# Patient Record
Sex: Male | Born: 1980 | Race: White | Hispanic: No | Marital: Married | Smoking: Never smoker
Health system: Southern US, Community
[De-identification: ages and names within clinical notes are randomized; demographics above are authoritative.]

## PROBLEM LIST (undated history)

## (undated) DIAGNOSIS — M109 Gout, unspecified: Secondary | ICD-10-CM

## (undated) DIAGNOSIS — R945 Abnormal results of liver function studies: Secondary | ICD-10-CM

## (undated) DIAGNOSIS — R7303 Prediabetes: Secondary | ICD-10-CM

## (undated) DIAGNOSIS — T7840XA Allergy, unspecified, initial encounter: Secondary | ICD-10-CM

## (undated) DIAGNOSIS — R7989 Other specified abnormal findings of blood chemistry: Secondary | ICD-10-CM

## (undated) DIAGNOSIS — E781 Pure hyperglyceridemia: Secondary | ICD-10-CM

## (undated) HISTORY — DX: Allergy, unspecified, initial encounter: T78.40XA

## (undated) HISTORY — DX: Pure hyperglyceridemia: E78.1

## (undated) HISTORY — PX: INGUINAL HERNIA REPAIR: SUR1180

## (undated) HISTORY — DX: Abnormal results of liver function studies: R94.5

## (undated) HISTORY — DX: Other specified abnormal findings of blood chemistry: R79.89

## (undated) HISTORY — DX: Prediabetes: R73.03

---

## 2002-04-24 ENCOUNTER — Encounter: Payer: Self-pay | Admitting: Emergency Medicine

## 2002-04-24 ENCOUNTER — Emergency Department (HOSPITAL_COMMUNITY): Admission: EM | Admit: 2002-04-24 | Discharge: 2002-04-24 | Payer: Self-pay | Admitting: Emergency Medicine

## 2002-10-23 ENCOUNTER — Inpatient Hospital Stay (HOSPITAL_COMMUNITY): Admission: EM | Admit: 2002-10-23 | Discharge: 2002-10-26 | Payer: Self-pay | Admitting: Emergency Medicine

## 2002-10-23 ENCOUNTER — Encounter: Payer: Self-pay | Admitting: Family Medicine

## 2002-10-23 ENCOUNTER — Encounter: Payer: Self-pay | Admitting: Emergency Medicine

## 2002-10-24 ENCOUNTER — Encounter: Payer: Self-pay | Admitting: Internal Medicine

## 2010-01-20 ENCOUNTER — Emergency Department: Payer: Self-pay | Admitting: Emergency Medicine

## 2010-05-16 ENCOUNTER — Ambulatory Visit: Payer: Self-pay | Admitting: General Practice

## 2012-06-20 ENCOUNTER — Encounter (HOSPITAL_COMMUNITY): Payer: Self-pay | Admitting: Emergency Medicine

## 2012-06-20 ENCOUNTER — Emergency Department (HOSPITAL_COMMUNITY): Payer: Worker's Compensation

## 2012-06-20 ENCOUNTER — Emergency Department (HOSPITAL_COMMUNITY)
Admission: EM | Admit: 2012-06-20 | Discharge: 2012-06-20 | Disposition: A | Discharge status: Home / Self Care | Payer: Worker's Compensation | Attending: Orthopedic Surgery | Admitting: Orthopedic Surgery

## 2012-06-20 DIAGNOSIS — S52599A Other fractures of lower end of unspecified radius, initial encounter for closed fracture: Secondary | ICD-10-CM | POA: Insufficient documentation

## 2012-06-20 DIAGNOSIS — S52502A Unspecified fracture of the lower end of left radius, initial encounter for closed fracture: Secondary | ICD-10-CM

## 2012-06-20 DIAGNOSIS — Y92410 Unspecified street and highway as the place of occurrence of the external cause: Secondary | ICD-10-CM | POA: Insufficient documentation

## 2012-06-20 MED ORDER — HYDROMORPHONE HCL PF 1 MG/ML IJ SOLN
INTRAMUSCULAR | Status: AC
  Filled 2012-06-20: qty 1

## 2012-06-20 MED ORDER — HYDROMORPHONE HCL PF 1 MG/ML IJ SOLN
INTRAMUSCULAR | Status: AC
  Administered 2012-06-20: 1 mg via INTRAVENOUS
  Filled 2012-06-20: qty 1

## 2012-06-20 MED ORDER — HYDROMORPHONE HCL PF 1 MG/ML IJ SOLN
1.0000 mg | Freq: Once | INTRAMUSCULAR | Status: AC
Start: 2012-06-20 — End: 2012-06-20
  Administered 2012-06-20: 1 mg via INTRAVENOUS

## 2012-06-20 MED ORDER — HYDROMORPHONE HCL PF 1 MG/ML IJ SOLN
1.0000 mg | Freq: Once | INTRAMUSCULAR | Status: AC
  Administered 2012-06-20: 1 mg via INTRAVENOUS

## 2012-06-20 MED ORDER — ONDANSETRON HCL 4 MG/2ML IJ SOLN
INTRAMUSCULAR | Status: AC
  Administered 2012-06-20: 4 mg
  Filled 2012-06-20: qty 2
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
## 2012-06-20 NOTE — ED Notes (Signed)
Per tommy satterfield pt's supervisor. Pt needs urine drug screen and etoh drawn at discharge.

## 2012-06-20 NOTE — ED Notes (Signed)
Discharge instructions reviewed with pt, questions answered. Pt verbalized understanding. Pt to lab for workers comp drug screen. Pt verbalized instructions to go to office in [place]
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
## 2012-06-21 ENCOUNTER — Encounter (HOSPITAL_COMMUNITY): Payer: Self-pay

## 2012-06-21 ENCOUNTER — Other Ambulatory Visit (HOSPITAL_COMMUNITY): Payer: Self-pay | Admitting: Orthopedic Surgery

## 2012-06-21 ENCOUNTER — Encounter (HOSPITAL_COMMUNITY): Payer: Self-pay | Admitting: Anesthesiology

## 2012-06-21 ENCOUNTER — Encounter (HOSPITAL_COMMUNITY)
Admission: AD | Disposition: A | Discharge status: Home / Self Care | Payer: Self-pay | Source: Ambulatory Visit | Attending: Orthopedic Surgery

## 2012-06-21 ENCOUNTER — Ambulatory Visit (HOSPITAL_COMMUNITY)
Admission: AD | Admit: 2012-06-21 | Discharge: 2012-06-22 | Disposition: A | Discharge status: Home / Self Care | Payer: Worker's Compensation | Source: Ambulatory Visit | Attending: Orthopedic Surgery | Admitting: Orthopedic Surgery

## 2012-06-21 ENCOUNTER — Ambulatory Visit (HOSPITAL_COMMUNITY): Payer: Worker's Compensation | Admitting: Anesthesiology

## 2012-06-21 DIAGNOSIS — S52599A Other fractures of lower end of unspecified radius, initial encounter for closed fracture: Secondary | ICD-10-CM | POA: Insufficient documentation

## 2012-06-21 DIAGNOSIS — S62101A Fracture of unspecified carpal bone, right wrist, initial encounter for closed fracture: Secondary | ICD-10-CM

## 2012-06-21 LAB — CBC
Hemoglobin: 14.6 g/dL (ref 13.0–17.0)
MCH: 31.1 pg (ref 26.0–34.0)
MCHC: 34.3 g/dL (ref 30.0–36.0)
MCV: 90.8 fL (ref 78.0–100.0)
RBC: 4.69 MIL/uL (ref 4.22–5.81)

## 2012-06-21 LAB — SURGICAL PCR SCREEN: MRSA, PCR: NEGATIVE

## 2012-06-21 SURGERY — OPEN REDUCTION INTERNAL FIXATION (ORIF) DISTAL RADIUS FRACTURE
Anesthesia: General | Site: Wrist | Laterality: Right | Wound class: Clean

## 2012-06-21 MED ORDER — MUPIROCIN 2 % EX OINT
TOPICAL_OINTMENT | Freq: Two times a day (BID) | CUTANEOUS | Status: DC
  Administered 2012-06-21: 1 via NASAL
  Filled 2012-06-21: qty 22

## 2012-06-21 MED ORDER — DEXTROSE 5 % IV SOLN
INTRAVENOUS | Status: DC | PRN
  Administered 2012-06-21: 19:00:00 via INTRAVENOUS

## 2012-06-21 MED ORDER — LACTATED RINGERS IV SOLN
INTRAVENOUS | Status: DC
  Administered 2012-06-21: 18:00:00 via INTRAVENOUS

## 2012-06-21 MED ORDER — FENTANYL CITRATE 0.05 MG/ML IJ SOLN
INTRAMUSCULAR | Status: DC | PRN
  Administered 2012-06-21: 100 ug via INTRAVENOUS
  Administered 2012-06-21: 50 ug via INTRAVENOUS

## 2012-06-21 MED ORDER — ARTIFICIAL TEARS OP OINT
TOPICAL_OINTMENT | OPHTHALMIC | Status: DC | PRN
  Administered 2012-06-21: 1 via OPHTHALMIC

## 2012-06-21 MED ORDER — OXYCODONE HCL 5 MG/5ML PO SOLN: 5.0000 mg | Freq: Once | ORAL | Status: DC | PRN

## 2012-06-21 MED ORDER — MIDAZOLAM HCL 5 MG/5ML IJ SOLN
INTRAMUSCULAR | Status: DC | PRN
  Administered 2012-06-21: 2 mg via INTRAVENOUS

## 2012-06-21 MED ORDER — PROPOFOL 10 MG/ML IV BOLUS
INTRAVENOUS | Status: DC | PRN
  Administered 2012-06-21: 200 mg via INTRAVENOUS

## 2012-06-21 MED ORDER — 0.9 % SODIUM CHLORIDE (POUR BTL) OPTIME
TOPICAL | Status: DC | PRN
  Administered 2012-06-21: 1000 mL

## 2012-06-21 MED ORDER — BUPIVACAINE-EPINEPHRINE PF 0.5-1:200000 % IJ SOLN
INTRAMUSCULAR | Status: DC | PRN
  Administered 2012-06-21: 30 mL

## 2012-06-21 MED ORDER — BUPIVACAINE HCL (PF) 0.25 % IJ SOLN
INTRAMUSCULAR | Status: DC | PRN
  Administered 2012-06-21: 10 mL

## 2012-06-21 MED ORDER — MUPIROCIN 2 % EX OINT
TOPICAL_OINTMENT | CUTANEOUS | Status: AC
  Administered 2012-06-21: 1 via NASAL
  Filled 2012-06-21: qty 22

## 2012-06-21 MED ORDER — HYDROMORPHONE HCL PF 1 MG/ML IJ SOLN: 0.5000 mg | INTRAMUSCULAR | Status: DC | PRN

## 2012-06-21 MED ORDER — LIDOCAINE HCL (CARDIAC) 20 MG/ML IV SOLN
INTRAVENOUS | Status: DC | PRN
  Administered 2012-06-21: 50 mg via INTRAVENOUS

## 2012-06-21 MED ORDER — MIDAZOLAM HCL 2 MG/2ML IJ SOLN
INTRAMUSCULAR | Status: AC
  Filled 2012-06-21: qty 2

## 2012-06-21 MED ORDER — FENTANYL CITRATE 0.05 MG/ML IJ SOLN
INTRAMUSCULAR | Status: AC
  Filled 2012-06-21: qty 2

## 2012-06-21 MED ORDER — CEFAZOLIN SODIUM-DEXTROSE 2-3 GM-% IV SOLR
INTRAVENOUS | Status: DC | PRN
  Administered 2012-06-21: 2 g via INTRAVENOUS

## 2012-06-21 MED ORDER — METOCLOPRAMIDE HCL 10 MG PO TABS: 5.0000 mg | ORAL_TABLET | Freq: Three times a day (TID) | ORAL | Status: DC | PRN

## 2012-06-21 MED ORDER — MIDAZOLAM HCL 2 MG/2ML IJ SOLN
0.5000 mg | INTRAMUSCULAR | Status: DC | PRN
  Administered 2012-06-21: 2 mg via INTRAVENOUS

## 2012-06-21 MED ORDER — CEFAZOLIN SODIUM-DEXTROSE 2-3 GM-% IV SOLR
INTRAVENOUS | Status: AC
  Filled 2012-06-21: qty 50

## 2012-06-21 MED ORDER — METHOCARBAMOL 500 MG PO TABS: 500.0000 mg | ORAL_TABLET | Freq: Four times a day (QID) | ORAL | Status: DC | PRN

## 2012-06-21 MED ORDER — OXYCODONE HCL 5 MG PO TABS: 5.0000 mg | ORAL_TABLET | Freq: Once | ORAL | Status: DC | PRN

## 2012-06-21 MED ORDER — HYDROCODONE-ACETAMINOPHEN 5-325 MG PO TABS: 1.0000 | ORAL_TABLET | ORAL | Status: DC | PRN

## 2012-06-21 MED ORDER — METOCLOPRAMIDE HCL 5 MG/ML IJ SOLN: 5.0000 mg | Freq: Three times a day (TID) | INTRAMUSCULAR | Status: DC | PRN

## 2012-06-21 MED ORDER — DEXAMETHASONE SODIUM PHOSPHATE 4 MG/ML IJ SOLN
INTRAMUSCULAR | Status: DC | PRN
  Administered 2012-06-21: 4 mg via INTRAVENOUS

## 2012-06-21 MED ORDER — ONDANSETRON HCL 4 MG PO TABS: 4.0000 mg | ORAL_TABLET | Freq: Four times a day (QID) | ORAL | Status: DC | PRN

## 2012-06-21 MED ORDER — ONDANSETRON HCL 4 MG/2ML IJ SOLN
INTRAMUSCULAR | Status: DC | PRN
  Administered 2012-06-21: 4 mg via INTRAVENOUS

## 2012-06-21 MED ORDER — FENTANYL CITRATE 0.05 MG/ML IJ SOLN
50.0000 ug | INTRAMUSCULAR | Status: DC | PRN
  Administered 2012-06-21: 100 ug via INTRAVENOUS

## 2012-06-21 MED ORDER — HYDROMORPHONE HCL PF 1 MG/ML IJ SOLN: 0.2500 mg | INTRAMUSCULAR | Status: DC | PRN

## 2012-06-21 MED ORDER — OXYCODONE-ACETAMINOPHEN 5-325 MG PO TABS: 1.0000 | ORAL_TABLET | ORAL | Status: DC | PRN

## 2012-06-21 MED ORDER — ONDANSETRON HCL 4 MG/2ML IJ SOLN: 4.0000 mg | Freq: Four times a day (QID) | INTRAMUSCULAR | Status: DC | PRN

## 2012-06-21 MED ORDER — METHOCARBAMOL 100 MG/ML IJ SOLN: 500.0000 mg | Freq: Four times a day (QID) | INTRAMUSCULAR | Status: DC | PRN

## 2012-06-21 MED ORDER — CEFAZOLIN SODIUM 1-5 GM-% IV SOLN
1.0000 g | Freq: Four times a day (QID) | INTRAVENOUS | Status: AC
  Administered 2012-06-22 (×3): 1 g via INTRAVENOUS
  Filled 2012-06-21 (×4): qty 50

## 2012-06-21 MED ORDER — CEFAZOLIN SODIUM-DEXTROSE 2-3 GM-% IV SOLR: 2.0000 g | INTRAVENOUS | Status: DC

## 2012-06-21 MED ORDER — LACTATED RINGERS IV SOLN
INTRAVENOUS | Status: DC | PRN
  Administered 2012-06-21 (×2): via INTRAVENOUS

## 2012-06-21 MED ORDER — SODIUM CHLORIDE 0.9 % IV SOLN
INTRAVENOUS | Status: DC
  Administered 2012-06-22: 20 mL/h via INTRAVENOUS

## 2012-06-21 SURGICAL SUPPLY — 58 items
BANDAGE GAUZE ELAST BULKY 4 IN (GAUZE/BANDAGES/DRESSINGS) ×2 IMPLANT
BIT DRILL 2 FAST STEP (BIT) ×2 IMPLANT
BIT DRILL 2.5X4 QC (BIT) ×2 IMPLANT
BNDG COHESIVE 4X5 TAN NS LF (GAUZE/BANDAGES/DRESSINGS) ×2 IMPLANT
BNDG COHESIVE 4X5 TAN STRL (GAUZE/BANDAGES/DRESSINGS) ×2 IMPLANT
BNDG ESMARK 4X9 LF (GAUZE/BANDAGES/DRESSINGS) ×2 IMPLANT
CLOTH BEACON ORANGE TIMEOUT ST (SAFETY) ×2 IMPLANT
COVER SURGICAL LIGHT HANDLE (MISCELLANEOUS) ×2 IMPLANT
CUFF TOURNIQUET SINGLE 18IN (TOURNIQUET CUFF) IMPLANT
CUFF TOURNIQUET SINGLE 24IN (TOURNIQUET CUFF) IMPLANT
DRAPE INCISE IOBAN 66X45 STRL (DRAPES) IMPLANT
DRAPE OEC MINIVIEW 54X84 (DRAPES) ×2 IMPLANT
DRAPE U-SHAPE 47X51 STRL (DRAPES) ×2 IMPLANT
DRSG ADAPTIC 3X8 NADH LF (GAUZE/BANDAGES/DRESSINGS) ×2 IMPLANT
DRSG EMULSION OIL 3X3 NADH (GAUZE/BANDAGES/DRESSINGS) IMPLANT
DRSG PAD ABDOMINAL 8X10 ST (GAUZE/BANDAGES/DRESSINGS) ×2 IMPLANT
DURAPREP 26ML APPLICATOR (WOUND CARE) ×2 IMPLANT
ELECT REM PT RETURN 9FT ADLT (ELECTROSURGICAL) ×2
ELECTRODE REM PT RTRN 9FT ADLT (ELECTROSURGICAL) ×1 IMPLANT
GAUZE SPONGE 4X4 16PLY XRAY LF (GAUZE/BANDAGES/DRESSINGS) ×2 IMPLANT
GLOVE BIOGEL M STER SZ 6 (GLOVE) ×2 IMPLANT
GLOVE BIOGEL PI IND STRL 6.5 (GLOVE) ×1 IMPLANT
GLOVE BIOGEL PI IND STRL 7.0 (GLOVE) ×1 IMPLANT
GLOVE BIOGEL PI IND STRL 9 (GLOVE) ×1 IMPLANT
GLOVE BIOGEL PI INDICATOR 6.5 (GLOVE) ×1
GLOVE BIOGEL PI INDICATOR 7.0 (GLOVE) ×1
GLOVE BIOGEL PI INDICATOR 9 (GLOVE) ×1
GLOVE ECLIPSE 6.5 STRL STRAW (GLOVE) ×2 IMPLANT
GLOVE SURG ORTHO 9.0 STRL STRW (GLOVE) ×4 IMPLANT
GOWN SRG XL XLNG 56XLVL 4 (GOWN DISPOSABLE) ×2 IMPLANT
GOWN STRL NON-REIN XL XLG LVL4 (GOWN DISPOSABLE) ×2
KIT BASIN OR (CUSTOM PROCEDURE TRAY) ×2 IMPLANT
KIT ROOM TURNOVER OR (KITS) ×2 IMPLANT
MANIFOLD NEPTUNE II (INSTRUMENTS) ×2 IMPLANT
NS IRRIG 1000ML POUR BTL (IV SOLUTION) ×2 IMPLANT
PACK ORTHO EXTREMITY (CUSTOM PROCEDURE TRAY) ×2 IMPLANT
PAD ARMBOARD 7.5X6 YLW CONV (MISCELLANEOUS) ×4 IMPLANT
PAD CAST 4YDX4 CTTN HI CHSV (CAST SUPPLIES) IMPLANT
PADDING CAST COTTON 4X4 STRL (CAST SUPPLIES)
PEG THREADED 2.5MMX20MM LONG (Peg) ×2 IMPLANT
PEG THREADED 2.5MMX22MM LONG (Peg) ×8 IMPLANT
PLATE STAN 24.4X59.5 RT (Plate) ×2 IMPLANT
SCREW CORT 3.5X14 LNG (Screw) ×6 IMPLANT
SPONGE GAUZE 4X4 12PLY (GAUZE/BANDAGES/DRESSINGS) ×2 IMPLANT
SPONGE LAP 18X18 X RAY DECT (DISPOSABLE) ×2 IMPLANT
STAPLER VISISTAT 35W (STAPLE) ×2 IMPLANT
STRIP CLOSURE SKIN 1/2X4 (GAUZE/BANDAGES/DRESSINGS) ×2 IMPLANT
SUCTION FRAZIER TIP 10 FR DISP (SUCTIONS) ×2 IMPLANT
SUT PROLENE 3 0 PS 1 (SUTURE) ×2 IMPLANT
SUT VIC AB 2-0 CTB1 (SUTURE) IMPLANT
SUT VIC AB 3-0 X1 27 (SUTURE) ×2 IMPLANT
SUT VICRYL 4-0 PS2 18IN ABS (SUTURE) IMPLANT
SYR BULB 3OZ (MISCELLANEOUS) ×2 IMPLANT
TOWEL OR 17X24 6PK STRL BLUE (TOWEL DISPOSABLE) ×2 IMPLANT
TOWEL OR 17X26 10 PK STRL BLUE (TOWEL DISPOSABLE) ×2 IMPLANT
TUBE CONNECTING 12X1/4 (SUCTIONS) ×2 IMPLANT
WATER STERILE IRR 1000ML POUR (IV SOLUTION) ×2 IMPLANT
YANKAUER SUCT BULB TIP NO VENT (SUCTIONS) ×2 IMPLANT

## 2012-06-21 NOTE — Anesthesia Preprocedure Evaluation (Signed)
Anesthesia Evaluation  Patient identified by MRN, date of birth, ID band Patient awake    Reviewed: Allergy & Precautions, H&P , NPO status , Patient's Chart, lab work & pertinent test results  Airway Mallampati: II TM Distance: >3 FB Neck ROM: Full    Dental No notable dental hx. (+) Teeth Intact and Dental Advisory Given   Pulmonary neg pulmonary ROS,  breath sounds clear to auscultation  Pulmonary exam normal       Cardiovascular negative cardio ROS  Rhythm:Regular Rate:Normal     Neuro/Psych negative neurological ROS  negative psych ROS   GI/Hepatic negative GI ROS, Neg liver ROS,   Endo/Other  negative endocrine ROS  Renal/GU negative Renal ROS  negative genitourinary   Musculoskeletal   Abdominal   Peds  Hematology negative hematology ROS (+)   Anesthesia Other Findings   Reproductive/Obstetrics negative OB ROS                           Anesthesia Physical Anesthesia Plan  ASA: I  Anesthesia Plan: General   Post-op Pain Management:    Induction: Intravenous  Airway Management Planned: LMA  Additional Equipment:   Intra-op Plan:   Post-operative Plan: Extubation in OR  Informed Consent: I have reviewed the patients History and Physical, chart, labs and discussed the procedure including the risks, benefits and alternatives for the proposed anesthesia with the patient or authorized representative who has indicated his/her understanding and acceptance.   Dental advisory given  Plan Discussed with: CRNA  Anesthesia Plan Comments:         Anesthesia Quick Evaluation  
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
## 2012-06-21 NOTE — Anesthesia Postprocedure Evaluation (Signed)
  Anesthesia Post-op Note  Patient: Donald Estrada  Procedure(s) Performed: Procedure(s) (LRB) with comments: OPEN REDUCTION INTERNAL FIXATION (ORIF) DISTAL RADIAL FRACTURE (Right) - Open Reduction Internal Fixation right Distal Radius Fracture  Patient Location: PACU  Anesthesia Type: GA combined with regional for post-op pain  Level of Consciousness: awake and alert   Airway and Oxygen Therapy: Patient Spontanous Breathing and Patient connected to nasal cannula oxygen  Post-op Pain: none  Post-op Assessment: Post-op Vital signs reviewed, Patient's Cardiovascular Status Stable, Respiratory Function Stable, Patent Airway and No signs of Nausea or vomiting  Post-op Vital Signs: Reviewed and stable  Complications: No apparent anesthesia complications

## 2012-06-21 NOTE — Anesthesia Procedure Notes (Addendum)
Anesthesia Regional Block:  Supraclavicular block  Pre-Anesthetic Checklist: ,, timeout performed, Correct Patient, Correct Site, Correct Laterality, Correct Procedure, Correct Position, site marked, Risks and benefits discussed, pre-op evaluation, post-op pain management  Laterality: Right  Prep: Maximum Sterile Barrier Precautions used and chloraprep       Needles:  Injection technique: Single-shot  Needle Type: Echogenic Stimulator Needle      Needle Gauge: 22 and 22 G    Additional Needles:  Procedures: ultrasound guided and nerve stimulator Supraclavicular block Narrative:  Start time: 06/21/2012 6:40 PM End time: 06/21/2012 6:51 PM Injection made incrementally with aspirations every 5 mL. Anesthesiologist: Fitzgerald,MD  Additional Notes: 2% Lidocaine skin wheel. Intercostobrachial block with 8cc of 0.25% Bupivicaine plain.  Supraclavicular block Procedure Name: LMA Insertion Date/Time: 06/21/2012 7:41 PM Performed by: Julianne Rice Z Pre-anesthesia Checklist: Patient identified, Timeout performed, Emergency Drugs available, Suction available and Patient being monitored Patient Re-evaluated:Patient Re-evaluated prior to inductionOxygen Delivery Method: Circle system utilized Preoxygenation: Pre-oxygenation with 100% oxygen Intubation Type: IV induction Ventilation: Mask ventilation without difficulty LMA: LMA inserted LMA Size: 4.0 Tube type: Oral Number of attempts: 1 Placement Confirmation: breath sounds checked- equal and bilateral and positive ETCO2 Tube secured with: Tape Dental Injury: Teeth and Oropharynx as per pre-operative assessment

## 2012-06-21 NOTE — Preoperative (Signed)
Beta Blockers   Reason not to administer Beta Blockers:Not Applicable 

## 2012-06-21 NOTE — Transfer of Care (Signed)
Immediate Anesthesia Transfer of Care Note  Patient: Donald Estrada  Procedure(s) Performed: Procedure(s) (LRB) with comments: OPEN REDUCTION INTERNAL FIXATION (ORIF) DISTAL RADIAL FRACTURE (Right) - Open Reduction Internal Fixation right Distal Radius Fracture  Patient Location: PACU  Anesthesia Type: General and GA combined with regional for post-op pain  Level of Consciousness: oriented, sedated and patient cooperative  Airway & Oxygen Therapy: Patient Spontanous Breathing and Patient connected to nasal cannula oxygen  Post-op Assessment: Report given to PACU RN and Post -op Vital signs reviewed and stable  Post vital signs: Reviewed and stable  Complications: No apparent anesthesia complications
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
## 2012-06-22 MED ORDER — OXYCODONE-ACETAMINOPHEN 7.5-325 MG PO TABS: 1.0000 | ORAL_TABLET | ORAL | Status: DC | PRN

## 2012-06-22 NOTE — Discharge Summary (Signed)
Physician Discharge Summary  Patient ID: Donald Estrada MRN: [id number] DOB/AGE: Jul 13, 1981 31 y.o.  Admit date: 06/21/2012 Discharge date: 06/22/2012  Admission Diagnoses: Volar right intra-articular distal radius fracture.  Discharge Diagnoses: Same Active Problems:  * No active hospital problems. *    Discharged Condition: stable  Hospital Course: Patient's hospital course was essentially unremarkable. He underwent open reduction internal fixation of the right distal radius fracture. Postoperatively patient was comfortable and was discharged to home in stable condition.  Consults: None  Significant Diagnostic Studies: labs: Routine labs  Treatments: surgery: See operative note  Discharge Exam: Blood pressure 118/57, pulse 74, temperature 98.3 F (36.8 C), temperature source Oral, resp. rate 20, height 5\' 9"  (1.753 m), weight 86.6 kg (190 lb 14.7 oz), SpO2 99.00%. Incision/Wound: dressing clean dry and intact at time of discharge  Disposition: 01-Home or Self Care     Medication List     As of 06/22/2012  7:17 AM    ASK your doctor about these medications         ibuprofen 200 MG tablet   Commonly known as: ADVIL,MOTRIN   Take 600 mg by mouth every 6 (six) hours as needed. For pain      oxyCODONE-acetaminophen 5-325 MG per tablet   Commonly known as: PERCOCET/ROXICET   Take 1 tablet by mouth every 6 (six) hours as needed. For pain           Follow-up Information    Follow up with Landree Fernholz V, MD. In 2 weeks.   Contact information:   [address] [place] [address] [phone]          Signed: Nadara Mustard 06/22/2012, 7:17 AM

## 2015-06-13 ENCOUNTER — Encounter: Payer: Self-pay | Admitting: *Deleted

## 2015-06-21 ENCOUNTER — Encounter: Payer: Self-pay | Admitting: Family Medicine

## 2015-06-21 ENCOUNTER — Ambulatory Visit (INDEPENDENT_AMBULATORY_CARE_PROVIDER_SITE_OTHER): Payer: BLUE CROSS/BLUE SHIELD | Admitting: Family Medicine

## 2015-06-21 ENCOUNTER — Other Ambulatory Visit: Payer: Self-pay | Admitting: Family Medicine

## 2015-06-21 VITALS — BP 118/92 | HR 84 | Temp 98.9°F | Resp 16 | Ht 69.0 in | Wt 210.0 lb

## 2015-06-21 DIAGNOSIS — Z23 Encounter for immunization: Secondary | ICD-10-CM

## 2015-06-21 DIAGNOSIS — Z0000 Encounter for general adult medical examination without abnormal findings: Secondary | ICD-10-CM

## 2015-06-21 DIAGNOSIS — R5382 Chronic fatigue, unspecified: Secondary | ICD-10-CM | POA: Diagnosis not present

## 2015-06-21 LAB — LIPID PANEL
CHOLESTEROL: 204 mg/dL — AB (ref 125–200)
HDL: 18 mg/dL — AB (ref >=40)
TRIGLYCERIDES: 1157 mg/dL — AB (ref <150)
Total CHOL/HDL Ratio: 11.3 Ratio — ABNORMAL HIGH (ref <=5.0)

## 2015-06-21 LAB — COMPLETE METABOLIC PANEL WITH GFR
ALT: 152 U/L — AB (ref 9–46)
AST: 64 U/L — ABNORMAL HIGH (ref 10–40)
Albumin: 5 g/dL (ref 3.6–5.1)
Alkaline Phosphatase: 71 U/L (ref 40–115)
BILIRUBIN TOTAL: 0.6 mg/dL (ref 0.2–1.2)
BUN: 18 mg/dL (ref 7–25)
CALCIUM: 9.9 mg/dL (ref 8.6–10.3)
CHLORIDE: 101 mmol/L (ref 98–110)
CO2: 26 mmol/L (ref 20–31)
CREATININE: 1.14 mg/dL (ref 0.60–1.35)
GFR, EST NON AFRICAN AMERICAN: 83 mL/min (ref >=60)
Glucose, Bld: 116 mg/dL — ABNORMAL HIGH (ref 70–99)
Potassium: 4.5 mmol/L (ref 3.5–5.3)
Sodium: 135 mmol/L (ref 135–146)
TOTAL PROTEIN: 7.6 g/dL (ref 6.1–8.1)

## 2015-06-21 LAB — CBC WITH DIFFERENTIAL/PLATELET
Basophils Absolute: 0.1 10*3/uL (ref 0.0–0.1)
Basophils Relative: 1 % (ref 0–1)
EOS ABS: 0.1 10*3/uL (ref 0.0–0.7)
EOS PCT: 1 % (ref 0–5)
HEMATOCRIT: 48.4 % (ref 39.0–52.0)
Hemoglobin: 16.8 g/dL (ref 13.0–17.0)
LYMPHS ABS: 2.3 10*3/uL (ref 0.7–4.0)
LYMPHS PCT: 31 % (ref 12–46)
MCH: 31.9 pg (ref 26.0–34.0)
MCHC: 34.7 g/dL (ref 30.0–36.0)
MCV: 92 fL (ref 78.0–100.0)
MONO ABS: 0.4 10*3/uL (ref 0.1–1.0)
MONOS PCT: 6 % (ref 3–12)
MPV: 9.6 fL (ref 8.6–12.4)
Neutro Abs: 4.5 10*3/uL (ref 1.7–7.7)
Neutrophils Relative %: 61 % (ref 43–77)
PLATELETS: 263 10*3/uL (ref 150–400)
RBC: 5.26 MIL/uL (ref 4.22–5.81)
RDW: 13.3 % (ref 11.5–15.5)
WBC: 7.4 10*3/uL (ref 4.0–10.5)

## 2015-06-21 LAB — TSH: TSH: 1.614 u[IU]/mL (ref 0.350–4.500)
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
## 2015-06-22 LAB — TESTOSTERONE: Testosterone: 311 ng/dL (ref 300–890)

## 2015-06-24 ENCOUNTER — Encounter: Payer: Self-pay | Admitting: Family Medicine

## 2015-06-24 LAB — HEPATITIS PANEL, ACUTE
HCV Ab: NEGATIVE
HEP A IGM: NONREACTIVE
HEP B C IGM: NONREACTIVE
Hepatitis B Surface Ag: NEGATIVE

## 2015-06-26 ENCOUNTER — Other Ambulatory Visit: Payer: Self-pay | Admitting: Family Medicine

## 2015-06-26 DIAGNOSIS — R945 Abnormal results of liver function studies: Principal | ICD-10-CM

## 2015-06-26 DIAGNOSIS — R7989 Other specified abnormal findings of blood chemistry: Secondary | ICD-10-CM

## 2015-07-03 ENCOUNTER — Ambulatory Visit
Admission: RE | Admit: 2015-07-03 | Discharge: 2015-07-03 | Disposition: A | Discharge status: Home / Self Care | Payer: Worker's Compensation | Source: Ambulatory Visit | Attending: Family Medicine | Admitting: Family Medicine

## 2015-07-03 DIAGNOSIS — R7989 Other specified abnormal findings of blood chemistry: Secondary | ICD-10-CM

## 2015-07-03 DIAGNOSIS — R945 Abnormal results of liver function studies: Principal | ICD-10-CM

## 2015-07-29 ENCOUNTER — Encounter: Payer: Self-pay | Admitting: Family Medicine

## 2015-07-29 ENCOUNTER — Ambulatory Visit (INDEPENDENT_AMBULATORY_CARE_PROVIDER_SITE_OTHER): Payer: BLUE CROSS/BLUE SHIELD | Admitting: Family Medicine

## 2015-07-29 VITALS — BP 108/60 | HR 76 | Temp 98.7°F | Resp 18 | Wt 206.0 lb

## 2015-07-29 DIAGNOSIS — E781 Pure hyperglyceridemia: Secondary | ICD-10-CM

## 2015-07-29 DIAGNOSIS — M25572 Pain in left ankle and joints of left foot: Secondary | ICD-10-CM | POA: Diagnosis not present

## 2015-07-29 DIAGNOSIS — K760 Fatty (change of) liver, not elsewhere classified: Secondary | ICD-10-CM | POA: Diagnosis not present

## 2015-07-29 NOTE — Progress Notes (Signed)
Subjective:    Patient ID: Donald Estrada, male    DOB: Sep 18, 1981, 34 y.o.   MRN: [id number]  HPI After his first visit, the patient was found to have triglycerides greater than 1000!  He was also found to have elevated LFTs. Viral hepatitis panel was negative. Right upper quadrant ultrasound showed fatty infiltration consistent with fatty liver disease. My working diagnosis is fatty liver disease secondary to hypertriglyceridemia/metabolic syndrome as the patient also had prediabetes. He is trying 3 months of aggressive lifestyle changes including diet exercise and weight loss with a plan of rechecking triglycerides in January and a triglyceride levels are not less than 400 and January, beginning fenofibrate 160 mg by mouth daily. However over the last 3 months, the patient has recurrent pain in his left foot. It typically occurs at the first MTP joint or in the dorsal midfoot. The pain occurs without warning. It is severe. It lasts several days. The foot will become hot and extremely painful to the touch. It is so painful that he doesn't want to put weight on his foot. It hurts for the sheet to touch his feet at night. He also has a history of kidney stones and in the past he was told that they were uric acid stones. Past Medical History  Diagnosis Date  . Allergy     seasonal  . Elevated LFTs   . Hypertriglyceridemia   . Prediabetes    Past Surgical History  Procedure Laterality Date  . Inguinal hernia repair      right   Current Outpatient Prescriptions on File Prior to Visit  Medication Sig Dispense Refill  . omeprazole (PRILOSEC) 20 MG capsule Take 20 mg by mouth daily as needed.     No current facility-administered medications on file prior to visit.   No Known Allergies Social History   Social History  . Marital Status: Married    Spouse Name: N/A  . Number of Children: N/A  . Years of Education: N/A   Occupational History  . Not on file.   Social History Main Topics    . Smoking status: Never Smoker   . Smokeless tobacco: Current User    Types: Chew, Snuff  . Alcohol Use: Yes     Comment: rarely drinks  . Drug Use: No  . Sexual Activity: Yes    Birth Control/ Protection: Surgical   Other Topics Concern  . Not on file   Social History Narrative     Review of Systems  All other systems reviewed and are negative.      Objective:   Physical Exam  Cardiovascular: Normal rate, regular rhythm and normal heart sounds.   Pulmonary/Chest: Effort normal and breath sounds normal.  Abdominal: Soft. Bowel sounds are normal.  Musculoskeletal:       Left foot: There is normal range of motion, no tenderness, no bony tenderness and no deformity.          Assessment & Plan:  Fatty liver disease, nonalcoholic  Hypertriglyceridemia  Pain in joint, ankle and foot, left - Plan: Uric acid  I suspect the patient has gout secondary to his metabolic syndrome. I will check a uric acid level. He is not in acute flare now. If his uric acid level is greater than 6, I would recommend uloric 40 mg poqday in addition to colchicine 0.6 mg by mouth daily with a plan of rechecking uric acid levels in 6 weeks. Once uric acid levels are less than 6, I will  discontinue colchicine and leave the patient on uloric only. I would like to recheck a fasting lipid panel and CMP in January. Triglycerides are greater than 400 at that time, I will start the patient on fenofibrate 160 mg by mouth daily

## 2015-07-30 ENCOUNTER — Encounter: Payer: Self-pay | Admitting: Family Medicine

## 2015-07-30 ENCOUNTER — Other Ambulatory Visit: Payer: Self-pay | Admitting: *Deleted

## 2015-07-30 LAB — URIC ACID: Uric Acid, Serum: 7.5 mg/dL (ref 4.0–7.8)

## 2015-07-30 MED ORDER — FEBUXOSTAT 40 MG PO TABS: 40.0000 mg | ORAL_TABLET | Freq: Every day | ORAL | Status: DC

## 2015-07-30 MED ORDER — COLCHICINE 0.6 MG PO CAPS: 0.6000 mg | ORAL_CAPSULE | Freq: Every day | ORAL | Status: DC

## 2015-08-05 ENCOUNTER — Telehealth: Payer: Self-pay | Admitting: *Deleted

## 2015-08-05 NOTE — Telephone Encounter (Signed)
Received request from pharmacy for PA on [employer].   PA submitted.   Dx: E79.0, M25.572.

## 2015-08-08 MED ORDER — ALLOPURINOL 100 MG PO TABS: 100.0000 mg | ORAL_TABLET | Freq: Every day | ORAL | Status: DC

## 2015-08-08 NOTE — Telephone Encounter (Signed)
Prescription sent to pharmacy.   Call placed to patient. LMTRC.  

## 2015-08-08 NOTE — Telephone Encounter (Signed)
Received PA determination.   PA denied.   Patient must try and fail allopurinol.   Please advise.

## 2015-08-08 NOTE — Telephone Encounter (Signed)
He has not been treated with Allopurinol (has not failed this) Kidney function normal. (No contraindication to use allopurinol)  Start allopurinol 100 mg 1 by mouth daily. #30+5 refills.

## 2015-08-09 NOTE — Telephone Encounter (Signed)
Call placed to patient. LMTRC.  

## 2015-08-14 ENCOUNTER — Encounter: Payer: Self-pay | Admitting: *Deleted

## 2015-08-14 NOTE — Telephone Encounter (Signed)
Multiple calls placed to patient with no answer and no return call.   Letter sent.  

## 2015-08-18 ENCOUNTER — Emergency Department
Admission: EM | Admit: 2015-08-18 | Discharge: 2015-08-18 | Disposition: A | Discharge status: Home / Self Care | Payer: BLUE CROSS/BLUE SHIELD | Attending: Emergency Medicine | Admitting: Emergency Medicine

## 2015-08-18 ENCOUNTER — Emergency Department: Payer: BLUE CROSS/BLUE SHIELD

## 2015-08-18 ENCOUNTER — Encounter: Payer: Self-pay | Admitting: Emergency Medicine

## 2015-08-18 DIAGNOSIS — N200 Calculus of kidney: Secondary | ICD-10-CM

## 2015-08-18 DIAGNOSIS — Z79899 Other long term (current) drug therapy: Secondary | ICD-10-CM | POA: Diagnosis not present

## 2015-08-18 DIAGNOSIS — R52 Pain, unspecified: Secondary | ICD-10-CM

## 2015-08-18 DIAGNOSIS — R109 Unspecified abdominal pain: Secondary | ICD-10-CM | POA: Diagnosis present

## 2015-08-18 HISTORY — DX: Gout, unspecified: M10.9

## 2015-08-18 LAB — URINALYSIS COMPLETE WITH MICROSCOPIC (ARMC ONLY)
Bilirubin Urine: NEGATIVE
Glucose, UA: NEGATIVE mg/dL
Ketones, ur: NEGATIVE mg/dL
Leukocytes, UA: NEGATIVE
Nitrite: NEGATIVE
Protein, ur: 100 mg/dL — AB
Specific Gravity, Urine: 1.029 (ref 1.005–1.030)
pH: 5 (ref 5.0–8.0)

## 2015-08-18 LAB — CBC WITH DIFFERENTIAL/PLATELET
Basophils Absolute: 0.1 10*3/uL (ref 0–0.1)
Basophils Relative: 1 %
EOS ABS: 0.1 10*3/uL (ref 0–0.7)
EOS PCT: 2 %
HCT: 47.5 % (ref 40.0–52.0)
HEMOGLOBIN: 16 g/dL (ref 13.0–18.0)
LYMPHS ABS: 2.2 10*3/uL (ref 1.0–3.6)
LYMPHS PCT: 30 %
MCH: 31 pg (ref 26.0–34.0)
MCHC: 33.7 g/dL (ref 32.0–36.0)
MCV: 92.2 fL (ref 80.0–100.0)
MONOS PCT: 8 %
Monocytes Absolute: 0.6 10*3/uL (ref 0.2–1.0)
Neutro Abs: 4.4 10*3/uL (ref 1.4–6.5)
Neutrophils Relative %: 59 %
PLATELETS: 264 10*3/uL (ref 150–440)
RBC: 5.15 MIL/uL (ref 4.40–5.90)
RDW: 12.7 % (ref 11.5–14.5)
WBC: 7.4 10*3/uL (ref 3.8–10.6)

## 2015-08-18 LAB — BASIC METABOLIC PANEL
Anion gap: 6 (ref 5–15)
BUN: 16 mg/dL (ref 6–20)
CHLORIDE: 105 mmol/L (ref 101–111)
CO2: 30 mmol/L (ref 22–32)
CREATININE: 1.16 mg/dL (ref 0.61–1.24)
Calcium: 10 mg/dL (ref 8.9–10.3)
GFR calc Af Amer: >60 mL/min (ref >60)
GFR calc non Af Amer: >60 mL/min (ref >60)
GLUCOSE: 109 mg/dL — AB (ref 65–99)
POTASSIUM: 4.2 mmol/L (ref 3.5–5.1)
SODIUM: 141 mmol/L (ref 135–145)

## 2015-08-18 MED ORDER — ONDANSETRON HCL 4 MG PO TABS: 4.0000 mg | ORAL_TABLET | Freq: Every day | ORAL | Status: DC | PRN

## 2015-08-18 MED ORDER — HYDROMORPHONE HCL 1 MG/ML IJ SOLN
1.0000 mg | Freq: Once | INTRAMUSCULAR | Status: AC
  Administered 2015-08-18: 1 mg via INTRAVENOUS
  Filled 2015-08-18: qty 1

## 2015-08-18 MED ORDER — HYDROMORPHONE HCL 1 MG/ML IJ SOLN
1.0000 mg | Freq: Once | INTRAMUSCULAR | Status: AC
  Administered 2015-08-18: 1 mg via INTRAVENOUS

## 2015-08-18 MED ORDER — SODIUM CHLORIDE 0.9 % IV BOLUS (SEPSIS)
1000.0000 mL | Freq: Once | INTRAVENOUS | Status: AC
  Administered 2015-08-18: 1000 mL via INTRAVENOUS

## 2015-08-18 MED ORDER — OXYCODONE-ACETAMINOPHEN 5-325 MG PO TABS: 1.0000 | ORAL_TABLET | ORAL | Status: DC | PRN

## 2015-08-18 MED ORDER — ONDANSETRON HCL 4 MG/2ML IJ SOLN
4.0000 mg | Freq: Once | INTRAMUSCULAR | Status: AC
  Administered 2015-08-18: 4 mg via INTRAVENOUS
  Filled 2015-08-18: qty 2

## 2015-08-18 MED ORDER — HYDROMORPHONE HCL 1 MG/ML IJ SOLN
INTRAMUSCULAR | Status: AC
  Administered 2015-08-18: 1 mg via INTRAVENOUS
  Filled 2015-08-18: qty 1

## 2015-08-18 MED ORDER — TAMSULOSIN HCL 0.4 MG PO CAPS: 0.4000 mg | ORAL_CAPSULE | Freq: Every day | ORAL | Status: DC

## 2015-08-18 NOTE — Discharge Instructions (Signed)
Please seek medical attention for any high fevers, chest pain, shortness of breath, change in behavior, persistent vomiting, bloody stool or any other new or concerning symptoms. ° ° °Kidney Stones °Kidney stones (urolithiasis) are deposits that form inside your kidneys. The intense pain is caused by the stone moving through the urinary tract. When the stone moves, the ureter goes into spasm around the stone. The stone is usually passed in the urine.  °CAUSES  °· A disorder that makes certain neck glands produce too much parathyroid hormone (primary hyperparathyroidism). °· A buildup of uric acid crystals, similar to gout in your joints. °· Narrowing (stricture) of the ureter. °· A kidney obstruction present at birth (congenital obstruction). °· Previous surgery on the kidney or ureters. °· Numerous kidney infections. °SYMPTOMS  °· Feeling sick to your stomach (nauseous). °· Throwing up (vomiting). °· Blood in the urine (hematuria). °· Pain that usually spreads (radiates) to the groin. °· Frequency or urgency of urination. °DIAGNOSIS  °· Taking a history and physical exam. °· Blood or urine tests. °· CT scan. °· Occasionally, an examination of the inside of the urinary bladder (cystoscopy) is performed. °TREATMENT  °· Observation. °· Increasing your fluid intake. °· Extracorporeal shock wave lithotripsy--This is a noninvasive procedure that uses shock waves to break up kidney stones. °· Surgery may be needed if you have severe pain or persistent obstruction. There are various surgical procedures. Most of the procedures are performed with the use of small instruments. Only small incisions are needed to accommodate these instruments, so recovery time is minimized. °The size, location, and chemical composition are all important variables that will determine the proper choice of action for you. Talk to your health care provider to better understand your situation so that you will minimize the risk of injury to yourself  and your kidney.  °HOME CARE INSTRUCTIONS  °· Drink enough water and fluids to keep your urine clear or pale yellow. This will help you to pass the stone or stone fragments. °· Strain all urine through the provided strainer. Keep all particulate matter and stones for your health care provider to see. The stone causing the pain may be as small as a grain of salt. It is very important to use the strainer each and every time you pass your urine. The collection of your stone will allow your health care provider to analyze it and verify that a stone has actually passed. The stone analysis will often identify what you can do to reduce the incidence of recurrences. °· Only take over-the-counter or prescription medicines for pain, discomfort, or fever as directed by your health care provider. °· Keep all follow-up visits as told by your health care provider. This is important. °· Get follow-up X-rays if required. The absence of pain does not always mean that the stone has passed. It may have only stopped moving. If the urine remains completely obstructed, it can cause loss of kidney function or even complete destruction of the kidney. It is your responsibility to make sure X-rays and follow-ups are completed. Ultrasounds of the kidney can show blockages and the status of the kidney. Ultrasounds are not associated with any radiation and can be performed easily in a matter of minutes. °· Make changes to your daily diet as told by your health care provider. You may be told to: °¨ Limit the amount of salt that you eat. °¨ Eat 5 or more servings of fruits and vegetables each day. °¨ Limit the amount of meat,   poultry, fish, and eggs that you eat. °· Collect a 24-hour urine sample as told by your health care provider. You may need to collect another urine sample every 6-12 months. °SEEK MEDICAL CARE IF: °· You experience pain that is progressive and unresponsive to any pain medicine you have been prescribed. °SEEK IMMEDIATE  MEDICAL CARE IF:  °· Pain cannot be controlled with the prescribed medicine. °· You have a fever or shaking chills. °· The severity or intensity of pain increases over 18 hours and is not relieved by pain medicine. °· You develop a new onset of abdominal pain. °· You feel faint or pass out. °· You are unable to urinate. °  °This information is not intended to replace advice given to you by your health care provider. Make sure you discuss any questions you have with your health care provider. °  °Document Released: 09/07/2005 Document Revised: 05/29/2015 Document Reviewed: 02/08/2013 °Elsevier Interactive Patient Education ©2016 [employer] ° °

## 2015-08-18 NOTE — ED Notes (Signed)
Discussed discharge instructions, prescriptions, and follow-up care with patient. No questions or concerns at this time. Pt stable at discharge.  

## 2015-08-18 NOTE — ED Provider Notes (Signed)
CT returns stone is passed into the bladder patient feels better. Discharge  Arnaldo NatalPaul F Cassie Shedlock, MD 08/18/15 [phone]

## 2015-08-18 NOTE — ED Notes (Signed)
Pt to ED c/o L flank pain and hematuria that started this morning. Pt states he has had kidney stones in the past and this feels similar.

## 2015-08-18 NOTE — ED Provider Notes (Signed)
[facility] Emergency Department Provider Note  ____________________________________________  Time seen: 1425  I have reviewed the triage vital signs and the nursing notes.   HISTORY  Chief Complaint Flank Pain and Hematuria   History limited by: Not Limited   HPI Donald Estrada is a 34 y.o. male who presents to the emergency department today with left flank pain. The patient states that the pain started suddenly this morning. He describes it as burning. It is located in his left flank. It has come and gone. It is severe. It will radiate into his abdomen. He states that it feels similar to previous kidney stones. He has noticed some hematuria. He denies any fevers.   Past Medical History  Diagnosis Date  . Allergy     seasonal  . Elevated LFTs   . Hypertriglyceridemia   . Prediabetes   . Gout     There are no active problems to display for this patient.   Past Surgical History  Procedure Laterality Date  . Inguinal hernia repair      right    Current Outpatient Rx  Name  Route  Sig  Dispense  Refill  . allopurinol (ZYLOPRIM) 100 MG tablet   Oral   Take 1 tablet (100 mg total) by mouth daily.   30 tablet   5   . Colchicine 0.6 MG CAPS   Oral   Take 0.6 mg by mouth daily.   30 capsule   0   . omeprazole (PRILOSEC) 20 MG capsule   Oral   Take 20 mg by mouth daily as needed.           Allergies Review of patient's allergies indicates no known allergies.  Family History  Problem Relation Age of Onset  . Adopted: Yes  . Heart disease Maternal Grandmother     Social History Social History  Substance Use Topics  . Smoking status: Never Smoker   . Smokeless tobacco: Current User    Types: Chew, Snuff  . Alcohol Use: Yes     Comment: rarely drinks    Review of Systems  Constitutional: Negative for fever. Cardiovascular: Negative for chest pain. Respiratory: Negative for shortness of breath. Gastrointestinal: positive  for left flank pain Genitourinary: Negative for dysuria. Musculoskeletal: Negative for back pain. Skin: Negative for rash. Neurological: Negative for headaches, focal weakness or numbness.  10-point ROS otherwise negative.  ____________________________________________   PHYSICAL EXAM:  VITAL SIGNS: ED Triage Vitals  Enc Vitals Group     BP 08/18/15 1418 159/99 mmHg     Pulse Rate 08/18/15 1418 63     Resp 08/18/15 1418 20     Temp 08/18/15 1418 98.1 F (36.7 C)     Temp Source 08/18/15 1418 Oral     SpO2 08/18/15 1418 99 %     Weight 08/18/15 1418 204 lb (92.534 kg)     Height 08/18/15 1418  (1.778 m)     Head Cir --      Peak Flow --      Pain Score 08/18/15 1418 6   Constitutional: Alert and oriented. Appears uncomfortable. Moving on the stretcher. Eyes: Conjunctivae are normal. PERRL. Normal extraocular movements. ENT   Head: Normocephalic and atraumatic.   Nose: No congestion/rhinnorhea.   Mouth/Throat: Mucous membranes are moist.   Neck: No stridor. Hematological/Lymphatic/Immunilogical: No cervical lymphadenopathy. Cardiovascular: Normal rate, regular rhythm.  No murmurs, rubs, or gallops. Respiratory: Normal respiratory effort without tachypnea nor retractions. Breath sounds are clear  and equal bilaterally. No wheezes/rales/rhonchi. Gastrointestinal: Soft and nontender. No distention. There is no CVA tenderness. Genitourinary: Deferred Musculoskeletal: Normal range of motion in all extremities. No joint effusions.  No lower extremity tenderness nor edema. Neurologic:  Normal speech and language. No gross focal neurologic deficits are appreciated.  Skin:  Skin is warm, dry and intact. No rash noted. Psychiatric: Mood and affect are normal. Speech and behavior are normal. Patient exhibits appropriate insight and judgment.  ____________________________________________    LABS (pertinent positives/negatives)  Labs Reviewed  BASIC METABOLIC  PANEL - Abnormal; Notable for the following:    Glucose, Bld 109 (*)    All other components within normal limits  URINALYSIS COMPLETEWITH MICROSCOPIC ([facility] ONLY) - Abnormal; Notable for the following:    Color, Urine AMBER (*)    APPearance HAZY (*)    Hgb urine dipstick 3+ (*)    Protein, ur 100 (*)    Bacteria, UA RARE (*)    Squamous Epithelial / LPF 0-5 (*)    All other components within normal limits  CBC WITH DIFFERENTIAL/PLATELET     ____________________________________________   EKG  None  ____________________________________________    RADIOLOGY  Renal US pending    ____________________________________________   PROCEDURES  Procedure(s) performed: None  Critical Care performed: No  ____________________________________________   INITIAL IMPRESSION / ASSESSMENT AND PLAN / ED COURSE  Pertinent labs & imaging results that were available during my care of the patient were reviewed by me and considered in my medical decision making (see chart for details).  Patient presented to the emergency department today because of concerns for left-sided flank pain consistent with previous history of kidney stones. Patient's physical exam is consistent with kidney stone. Will plan on getting an ultrasound to rule out any signs of obstruction as well as UA to investigate for signs of infection.  Ultrasound and UA pending at time of sign out. I did prepare paperwork in the event that they are both negative.  ____________________________________________   FINAL CLINICAL IMPRESSION(S) / ED DIAGNOSES  Final diagnoses:  Flank pain  Kidney stone     Phineas SemenGraydon Chesni Vos, MD 08/19/15 1447

## 2015-08-20 ENCOUNTER — Telehealth: Payer: Self-pay | Admitting: *Deleted

## 2015-08-20 NOTE — Telephone Encounter (Signed)
Received call from patient.   Reports that he has been out of town working and had not received any calls or letter from office at this time.   States that he was given enough Uloric samples to have for the month of November, but he is running out of samples. PA was denied for Uloric, and PA recommended to switch to Allopurinol 100mg  PO QD. Reports that he has not begun this treatment plan. Patient states that he is supposed to return on 09/13/2015 to have uric acid level re-drawn.   States that he was seen in ER on 08/18/2015 for flank pain and was advised that he passed another kidney stone and that kidney retained (4) stones at this time.   Requested MD to advise if he should continue Uloric samples, pick up Allopurinol as PA ordered on 08/08/2015, or go to see specialist for kidney stone.

## 2015-08-20 NOTE — Telephone Encounter (Signed)
I am not sure why he hasn't received any communication from the office. Looking back to the labs, Martie LeeSabrina states that she called the patient with the results regarding his uric acid and his uric acid level was elevated at 7.5 and this is certainly contributing to his gout attacks and contributing to his kidney stones. However notify the patient that insurance is not going to pay for the uloric and therefore I will switch the uloric immediately to allopurinol 200 mg poqday and recheck uric acid level 6 weeks after he has been on allopurinol.  He needs to continue colchicine 0.6 mg poqday until uric acid level has stabilized.  I will gladly refer the patient to a urologist if we can arrange this. However I believe the most prudent course would be to control his uric acid level with allopurinol. The kidney stones that he recently passed have been a long time in  forming. Therefore I do not believe that this is a reflection of a failure of his gout medication since he is just started that.

## 2015-08-21 MED ORDER — FEBUXOSTAT 40 MG PO TABS: 40.0000 mg | ORAL_TABLET | Freq: Every day | ORAL | Status: DC

## 2015-08-21 NOTE — Telephone Encounter (Signed)
Call placed to patient. Of note, patient stated in original phone call that he was out of town working without his cell phone (the only contact information in chart) and therefore did not receive messages until he returned.   Patient states that he has been taking samples of Uloric that he has obtained from another MD. States that he would prefer to continue Uloric and repeat uric Acid levels on 09/13/2015 as per PCP original plan.   Advised to continue Uloric and Cholchicine. Verbalized understanding.   Also agreed to MD reticence to Urology referral, and agreed to control uric acid level.

## 2015-08-30 ENCOUNTER — Telehealth: Payer: Self-pay | Admitting: Family Medicine

## 2015-08-30 MED ORDER — COLCHICINE 0.6 MG PO CAPS: 0.6000 mg | ORAL_CAPSULE | Freq: Every day | ORAL | Status: DC

## 2015-08-30 NOTE — Telephone Encounter (Signed)
Pt left a message with a question regarding his medication.  Please call [phone]

## 2015-08-30 NOTE — Telephone Encounter (Signed)
Pt just needed one refill of colchicine until he follow up with provider and repeat lab work.  #30 sent to pharmacy.

## 2015-09-13 ENCOUNTER — Other Ambulatory Visit: Payer: BLUE CROSS/BLUE SHIELD

## 2015-09-13 ENCOUNTER — Other Ambulatory Visit: Payer: Self-pay | Admitting: Family Medicine

## 2015-09-13 DIAGNOSIS — M109 Gout, unspecified: Secondary | ICD-10-CM

## 2015-09-13 DIAGNOSIS — Z79899 Other long term (current) drug therapy: Secondary | ICD-10-CM

## 2015-09-13 LAB — URIC ACID: URIC ACID, SERUM: 4 mg/dL (ref 4.0–7.8)

## 2015-09-17 ENCOUNTER — Encounter: Payer: Self-pay | Admitting: Family Medicine

## 2015-09-19 LAB — COMPREHENSIVE METABOLIC PANEL
ALBUMIN: 4.7 g/dL (ref 3.6–5.1)
ALT: 109 U/L — ABNORMAL HIGH (ref 9–46)
AST: 42 U/L — ABNORMAL HIGH (ref 10–40)
Alkaline Phosphatase: 51 U/L (ref 40–115)
BUN: 23 mg/dL (ref 7–25)
CHLORIDE: 101 mmol/L (ref 98–110)
CO2: 21 mmol/L (ref 20–31)
CREATININE: 1.12 mg/dL (ref 0.60–1.35)
Calcium: 10.1 mg/dL (ref 8.6–10.3)
Glucose, Bld: 101 mg/dL — ABNORMAL HIGH (ref 70–99)
POTASSIUM: 4.4 mmol/L (ref 3.5–5.3)
SODIUM: 138 mmol/L (ref 135–146)
Total Bilirubin: 0.2 mg/dL (ref 0.2–1.2)
Total Protein: 7.2 g/dL (ref 6.1–8.1)

## 2015-09-19 LAB — LIPID PANEL
CHOL/HDL RATIO: 8.8 ratio — AB (ref <=5.0)
Cholesterol: 184 mg/dL (ref 125–200)
HDL: 21 mg/dL — AB (ref >=40)
TRIGLYCERIDES: 491 mg/dL — AB (ref <150)

## 2015-09-24 ENCOUNTER — Other Ambulatory Visit: Payer: Self-pay | Admitting: Family Medicine

## 2015-09-24 MED ORDER — FENOFIBRATE 160 MG PO TABS: 160.0000 mg | ORAL_TABLET | Freq: Every day | ORAL | Status: DC

## 2015-09-26 ENCOUNTER — Telehealth: Payer: Self-pay | Admitting: Family Medicine

## 2015-09-26 NOTE — Telephone Encounter (Signed)
[phone] Patient would like to speak to you regarding his foot pain

## 2015-09-26 NOTE — Telephone Encounter (Signed)
Pt was off of colchicine for 2 days and his foot started hurting again so he took one this am and wanted to know if he should go back on it everyday or is there something else he should be taking beside the Uloric?

## 2015-09-27 MED ORDER — COLCHICINE 0.6 MG PO CAPS: 0.6000 mg | ORAL_CAPSULE | Freq: Every day | ORAL | Status: DC

## 2015-09-27 NOTE — Telephone Encounter (Signed)
Lets continue colchicine daily for an additional 2 months and then try to stop again.  He may need more time for his uric acid level to equilibrate.

## 2015-09-27 NOTE — Telephone Encounter (Signed)
Pt aware and med sent to pharm 

## 2015-12-24 ENCOUNTER — Telehealth: Payer: Self-pay | Admitting: Family Medicine

## 2015-12-24 NOTE — Telephone Encounter (Signed)
Pt has an appt on Friday to see Dr. Tanya NonesPickard. Please advise pt if he will need lab work before appt.  [phone]

## 2015-12-24 NOTE — Telephone Encounter (Signed)
Yes he does - please call and schedule lab appt.

## 2015-12-27 ENCOUNTER — Other Ambulatory Visit: Payer: BLUE CROSS/BLUE SHIELD

## 2015-12-27 ENCOUNTER — Ambulatory Visit: Payer: BLUE CROSS/BLUE SHIELD | Admitting: Family Medicine

## 2015-12-27 ENCOUNTER — Other Ambulatory Visit: Payer: Self-pay | Admitting: Family Medicine

## 2015-12-27 DIAGNOSIS — E781 Pure hyperglyceridemia: Secondary | ICD-10-CM | POA: Insufficient documentation

## 2015-12-27 DIAGNOSIS — K760 Fatty (change of) liver, not elsewhere classified: Secondary | ICD-10-CM

## 2015-12-27 LAB — COMPLETE METABOLIC PANEL WITH GFR
ALBUMIN: 4.6 g/dL (ref 3.6–5.1)
ALK PHOS: 38 U/L — AB (ref 40–115)
ALT: 71 U/L — AB (ref 9–46)
AST: 31 U/L (ref 10–40)
BILIRUBIN TOTAL: 0.4 mg/dL (ref 0.2–1.2)
BUN: 20 mg/dL (ref 7–25)
CALCIUM: 9.4 mg/dL (ref 8.6–10.3)
CHLORIDE: 105 mmol/L (ref 98–110)
CO2: 26 mmol/L (ref 20–31)
CREATININE: 1.14 mg/dL (ref 0.60–1.35)
GFR, Est Non African American: 83 mL/min (ref >=60)
Glucose, Bld: 101 mg/dL — ABNORMAL HIGH (ref 70–99)
Potassium: 4.5 mmol/L (ref 3.5–5.3)
Sodium: 141 mmol/L (ref 135–146)
TOTAL PROTEIN: 6.7 g/dL (ref 6.1–8.1)

## 2015-12-27 LAB — LIPID PANEL
CHOLESTEROL: 156 mg/dL (ref 125–200)
HDL: 23 mg/dL — ABNORMAL LOW (ref >=40)
LDL Cholesterol: 99 mg/dL (ref <130)
TRIGLYCERIDES: 169 mg/dL — AB (ref <150)
Total CHOL/HDL Ratio: 6.8 Ratio — ABNORMAL HIGH (ref <=5.0)
VLDL: 34 mg/dL — ABNORMAL HIGH (ref <30)

## 2015-12-31 LAB — URIC ACID: URIC ACID, SERUM: 3.1 mg/dL — AB (ref 4.0–7.8)

## 2016-01-03 ENCOUNTER — Ambulatory Visit (INDEPENDENT_AMBULATORY_CARE_PROVIDER_SITE_OTHER): Payer: BLUE CROSS/BLUE SHIELD | Admitting: Family Medicine

## 2016-01-03 ENCOUNTER — Encounter: Payer: Self-pay | Admitting: Family Medicine

## 2016-01-03 VITALS — BP 126/90 | HR 86 | Temp 98.4°F | Resp 16 | Wt 200.0 lb

## 2016-01-03 DIAGNOSIS — M1000 Idiopathic gout, unspecified site: Secondary | ICD-10-CM

## 2016-01-03 DIAGNOSIS — E781 Pure hyperglyceridemia: Secondary | ICD-10-CM | POA: Diagnosis not present

## 2016-01-03 DIAGNOSIS — K760 Fatty (change of) liver, not elsewhere classified: Secondary | ICD-10-CM

## 2016-01-03 MED ORDER — FENOFIBRATE 160 MG PO TABS: 160.0000 mg | ORAL_TABLET | Freq: Every day | ORAL | Status: DC

## 2016-01-03 MED ORDER — COLCHICINE 0.6 MG PO CAPS: 0.6000 mg | ORAL_CAPSULE | Freq: Every day | ORAL | Status: DC

## 2016-01-03 NOTE — Progress Notes (Signed)
Subjective:    Patient ID: Donald Estrada, male    DOB: Feb 09, 1981, 35 y.o.   MRN: [id number]  HPI 07/29/15 After his first visit, the patient was found to have triglycerides greater than 1000!  He was also found to have elevated LFTs. Viral hepatitis panel was negative. Right upper quadrant ultrasound showed fatty infiltration consistent with fatty liver disease. My working diagnosis is fatty liver disease secondary to hypertriglyceridemia/metabolic syndrome as the patient also had prediabetes. He is trying 3 months of aggressive lifestyle changes including diet exercise and weight loss with a plan of rechecking triglycerides in January and a triglyceride levels are not less than 400 and January, beginning fenofibrate 160 mg by mouth daily. However over the last 3 months, the patient has recurrent pain in his left foot. It typically occurs at the first MTP joint or in the dorsal midfoot. The pain occurs without warning. It is severe. It lasts several days. The foot will become hot and extremely painful to the touch. It is so painful that he doesn't want to put weight on his foot. It hurts for the sheet to touch his feet at night. He also has a history of kidney stones and in the past he was told that they were uric acid stones.  At that time, my plan was: I suspect the patient has gout secondary to his metabolic syndrome. I will check a uric acid level. He is not in acute flare now. If his uric acid level is greater than 6, I would recommend uloric 40 mg poqday in addition to colchicine 0.6 mg by mouth daily with a plan of rechecking uric acid levels in 6 weeks. Once uric acid levels are less than 6, I will discontinue colchicine and leave the patient on uloric only. I would like to recheck a fasting lipid panel and CMP in January. Triglycerides are greater than 400 at that time, I will start the patient on fenofibrate 160 mg by mouth daily  01/03/16 Patient took colchicine daily for 4 months and  discontinued it one month ago. He is still on Uloric 80 mg every day. He has not had a gout flare in over 3 months and seems to be tolerating the medicine well. Uric acid level was recently found to be 3.1!. He is tolerating the fenofibrate well 160 mg by mouth daily. He denies any myalgias or right upper quadrant pain. Trigs have fallen steadily from 1000-400 and now down to 167.  LDL cholesterol is excellent at 99. Liver function tests have been cut by 50% over the last 7 months. All of this is extremely reassuring. Unfortunately his HDL cholesterol remains low at 23 Lab on 12/27/2015  Component Date Value Ref Range Status  . Sodium 12/27/2015 141  135 - 146 mmol/L Final  . Potassium 12/27/2015 4.5  3.5 - 5.3 mmol/L Final  . Chloride 12/27/2015 105  98 - 110 mmol/L Final  . CO2 12/27/2015 26  20 - 31 mmol/L Final  . Glucose, Bld 12/27/2015 101* 70 - 99 mg/dL Final  . BUN 12/27/2015 20  7 - 25 mg/dL Final  . Creat 12/27/2015 1.14  0.60 - 1.35 mg/dL Final  . Total Bilirubin 12/27/2015 0.4  0.2 - 1.2 mg/dL Final  . Alkaline Phosphatase 12/27/2015 38* 40 - 115 U/L Final  . AST 12/27/2015 31  10 - 40 U/L Final  . ALT 12/27/2015 71* 9 - 46 U/L Final  . Total Protein 12/27/2015 6.7  6.1 - 8.1 g/dL Final  .  Albumin 12/27/2015 4.6  3.6 - 5.1 g/dL Final  . Calcium 12/27/2015 9.4  8.6 - 10.3 mg/dL Final  . GFR, Est African American 12/27/2015 >89  >=60 mL/min Final  . GFR, Est Non African American 12/27/2015 83  >=60 mL/min Final   Comment:   The estimated GFR is a calculation valid for adults (>=84 years old) that uses the CKD-EPI algorithm to adjust for age and sex. It is   not to be used for children, pregnant women, hospitalized patients,    patients on dialysis, or with rapidly changing kidney function. According to the NKDEP, eGFR >89 is normal, 60-89 shows mild impairment, 30-59 shows moderate impairment, 15-29 shows severe impairment and <15 is ESRD.     Marland Kitchen Cholesterol 12/27/2015 156  125  - 200 mg/dL Final  . Triglycerides 12/27/2015 169* <150 mg/dL Final  . HDL 12/27/2015 23* >=40 mg/dL Final  . Total CHOL/HDL Ratio 12/27/2015 6.8* <=5.0 Ratio Final  . VLDL 12/27/2015 34* <30 mg/dL Final  . LDL Cholesterol 12/27/2015 99  <130 mg/dL Final   Comment:   Total Cholesterol/HDL Ratio:CHD Risk                        Coronary Heart Disease Risk Table                                        Men       Women          1/2 Average Risk              3.4        3.3              Average Risk              5.0        4.4           2X Average Risk              9.6        7.1           3X Average Risk             23.4       11.0 Use the calculated Patient Ratio above and the CHD Risk table  to determine the patient's CHD Risk.   Orders Only on 12/27/2015  Component Date Value Ref Range Status  . Uric Acid, Serum 12/27/2015 3.1* 4.0 - 7.8 mg/dL Final    Past Medical History  Diagnosis Date  . Allergy     seasonal  . Elevated LFTs   . Hypertriglyceridemia   . Prediabetes   . Gout    Past Surgical History  Procedure Laterality Date  . Inguinal hernia repair      right   Current Outpatient Prescriptions on File Prior to Visit  Medication Sig Dispense Refill  . febuxostat (ULORIC) 40 MG tablet Take 1 tablet (40 mg total) by mouth daily. 30 tablet 1  . omeprazole (PRILOSEC) 20 MG capsule Take 20 mg by mouth daily as needed (heartburn and indigestion.).      No current facility-administered medications on file prior to visit.   No Known Allergies Social History   Social History  . Marital Status: Married    Spouse Name: N/A  . Number of Children: N/A  . Years of  Education: N/A   Occupational History  . Not on file.   Social History Main Topics  . Smoking status: Never Smoker   . Smokeless tobacco: Current User    Types: Chew, Snuff  . Alcohol Use: Yes     Comment: rarely drinks  . Drug Use: No  . Sexual Activity: Yes    Birth Control/ Protection: Surgical   Other  Topics Concern  . Not on file   Social History Narrative     Review of Systems  All other systems reviewed and are negative.      Objective:   Physical Exam  Cardiovascular: Normal rate, regular rhythm and normal heart sounds.   Pulmonary/Chest: Effort normal and breath sounds normal.  Abdominal: Soft. Bowel sounds are normal.  Musculoskeletal:       Left foot: There is normal range of motion, no tenderness, no bony tenderness and no deformity.          Assessment & Plan:  Fatty liver disease, nonalcoholic  Hypertriglyceridemia  Idiopathic gout, unspecified chronicity, unspecified site  Continue uloric 40 mg every day as it seems to be controlling his gout. Use colchicine for breakthroughs/flare. Triglycerides are well controlled. LDL cholesterol is well controlled. Liver function tests continue to improve as we have controlled his dyslipidemia. HDL cholesterol remains low. We did discuss Niaspan but at the present time I believe the benefits are outweighed by the risk and the patient elects not to proceed with starting Niaspan. Recheck in 6 months

## 2016-02-25 ENCOUNTER — Telehealth: Payer: Self-pay | Admitting: Family Medicine

## 2016-02-25 NOTE — Telephone Encounter (Signed)
Patient is calling to get samples of uloric if possible  [phone]

## 2016-02-25 NOTE — Telephone Encounter (Signed)
Samples of Uloric placed up front.

## 2016-03-13 ENCOUNTER — Encounter (HOSPITAL_COMMUNITY): Payer: Self-pay | Admitting: Emergency Medicine

## 2016-03-13 ENCOUNTER — Emergency Department (HOSPITAL_COMMUNITY)
Admission: EM | Admit: 2016-03-13 | Discharge: 2016-03-14 | Disposition: A | Discharge status: Home / Self Care | Payer: BLUE CROSS/BLUE SHIELD | Attending: Emergency Medicine | Admitting: Emergency Medicine

## 2016-03-13 DIAGNOSIS — M62830 Muscle spasm of back: Secondary | ICD-10-CM | POA: Insufficient documentation

## 2016-03-13 DIAGNOSIS — M549 Dorsalgia, unspecified: Secondary | ICD-10-CM | POA: Diagnosis present

## 2016-03-13 DIAGNOSIS — M546 Pain in thoracic spine: Secondary | ICD-10-CM

## 2016-03-13 MED ORDER — FENTANYL CITRATE (PF) 100 MCG/2ML IJ SOLN
50.0000 ug | INTRAMUSCULAR | Status: DC | PRN
  Administered 2016-03-13: 50 ug via INTRAVENOUS

## 2016-03-13 MED ORDER — FENTANYL CITRATE (PF) 100 MCG/2ML IJ SOLN
INTRAMUSCULAR | Status: DC
Start: 2016-03-13 — End: 2016-03-14
  Filled 2016-03-13: qty 2

## 2016-03-13 NOTE — ED Notes (Signed)
Has been taking oxycodone today for the pain with no change.

## 2016-03-13 NOTE — ED Notes (Signed)
Patient arrives with complaint of back pain. States onset of pain was sudden today at 1000. History of ruptured thoracic disc. Today he was cutting a tree with a chainsaw at time of onset. Denies injury or heavy lifting at time of onset. Able to walk, but exacerbates pain. Obviously in severe pain in triage. Recent history of kidney stone with hydronephrosis.

## 2016-03-14 LAB — URINALYSIS, ROUTINE W REFLEX MICROSCOPIC
Bilirubin Urine: NEGATIVE
Glucose, UA: NEGATIVE mg/dL
Hgb urine dipstick: NEGATIVE
KETONES UR: NEGATIVE mg/dL
LEUKOCYTES UA: NEGATIVE
NITRITE: NEGATIVE
PROTEIN: NEGATIVE mg/dL
Specific Gravity, Urine: 1.024 (ref 1.005–1.030)
pH: 5 (ref 5.0–8.0)

## 2016-03-14 MED ORDER — METHOCARBAMOL 500 MG PO TABS: 500.0000 mg | ORAL_TABLET | Freq: Three times a day (TID) | ORAL | Status: DC | PRN

## 2016-03-14 MED ORDER — KETOROLAC TROMETHAMINE 30 MG/ML IJ SOLN
30.0000 mg | Freq: Once | INTRAMUSCULAR | Status: AC
  Administered 2016-03-14: 30 mg via INTRAVENOUS
  Filled 2016-03-14: qty 1

## 2016-03-14 MED ORDER — HYDROMORPHONE HCL 2 MG PO TABS: 2.0000 mg | ORAL_TABLET | ORAL | Status: DC | PRN

## 2016-03-14 MED ORDER — METHOCARBAMOL 1000 MG/10ML IJ SOLN
500.0000 mg | Freq: Once | INTRAVENOUS | Status: AC
  Administered 2016-03-14: 500 mg via INTRAVENOUS
  Filled 2016-03-14 (×2): qty 5

## 2016-03-14 MED ORDER — PREDNISONE 10 MG (48) PO TBPK: ORAL_TABLET | Freq: Every day | ORAL | Status: DC

## 2016-03-14 MED ORDER — IBUPROFEN 800 MG PO TABS: 800.0000 mg | ORAL_TABLET | Freq: Three times a day (TID) | ORAL | Status: DC | PRN

## 2016-03-14 MED ORDER — METHOCARBAMOL 1000 MG/10ML IJ SOLN: 500.0000 mg | Freq: Once | INTRAMUSCULAR | Status: DC

## 2016-03-14 MED ORDER — METHYLPREDNISOLONE SODIUM SUCC 125 MG IJ SOLR
125.0000 mg | Freq: Once | INTRAMUSCULAR | Status: AC
  Administered 2016-03-14: 125 mg via INTRAVENOUS
  Filled 2016-03-14: qty 2

## 2016-03-14 MED ORDER — ONDANSETRON HCL 4 MG/2ML IJ SOLN
4.0000 mg | Freq: Once | INTRAMUSCULAR | Status: AC
  Administered 2016-03-14: 4 mg via INTRAVENOUS
  Filled 2016-03-14: qty 2

## 2016-03-14 MED ORDER — HYDROMORPHONE HCL 1 MG/ML IJ SOLN
1.0000 mg | Freq: Once | INTRAMUSCULAR | Status: AC
  Administered 2016-03-14: 1 mg via INTRAVENOUS
  Filled 2016-03-14: qty 1

## 2016-03-14 MED ORDER — DOCUSATE SODIUM 100 MG PO CAPS: 100.0000 mg | ORAL_CAPSULE | Freq: Two times a day (BID) | ORAL | Status: DC

## 2016-03-14 MED ORDER — SODIUM CHLORIDE 0.9 % IV BOLUS (SEPSIS)
1000.0000 mL | Freq: Once | INTRAVENOUS | Status: AC
  Administered 2016-03-14: 1000 mL via INTRAVENOUS

## 2016-03-14 NOTE — ED Notes (Signed)
Pt stable, ambulatory, states understanding of discharge instructions 

## 2016-03-14 NOTE — ED Notes (Addendum)
Pt able to ambulate without assistance and with minimal discomfort.

## 2016-03-14 NOTE — ED Provider Notes (Addendum)
By signing my name below, I, Donald Estrada, attest that this documentation has been prepared under the direction and in the presence of Imara Standiford N Branden Shallenberger, DO.  Electronically Signed: Rosario AdieWilliam Andrew Estrada, ED Scribe. 03/14/2016. 2:44 AM.  TIME SEEN: 12:40am  CHIEF COMPLAINT: back pain  HPI: HPI Comments: Donald Estrada is a 35 y.o. male with a PMHx significant for hypertriglyceridemia, kidney stones and Gout who presents to the Emergency Department complaining of sudden onset, unchanged, constant, 10/10, middle back pain onset ~14.5 hours PTA. Pt reports that he was cutting a tree with a chainsaw at the time of onset. Pt reports that his pain is exacerbated with ambulation, raising his left arm, and positional changes. He has taken 1 dose of Flomax 1 day ago, and two doses of Oxycodone today with no relief of his pain whatsoever. 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 last MRI was in 2004. Pt did not have surgical intervention upon being dx'd because he was "too young". At that time he was admitted and given IV steroids and upon d/c he was given oral steroids. Pt does have a hx of nephrolithiasis, and his last one was a few months PTA. States this feels different. He has not had any back surgeries, or back injections. No IVDA. Pt is not immunocompromised by HIV or DM. He does not have a hx of cancer. Pt denies numbness, tingling, weakness, dysuria, hematuria, abdominal pain, frequency, urgency, or bowel or bladder incontinence, or urinary retention.  ROS: See HPI Constitutional: no fever  Eyes: no drainage  ENT: no runny nose   Cardiovascular:  no chest pain  Resp: no SOB  GI: no vomiting GU: no dysuria Integumentary: no rash  Allergy: no hives  Musculoskeletal: no leg swelling  Neurological: no slurred speech ROS otherwise negative  PAST MEDICAL HISTORY/PAST SURGICAL HISTORY:  Past Medical History   Diagnosis Date  . Allergy     seasonal  . Elevated LFTs   . Hypertriglyceridemia   . Prediabetes   . Gout     MEDICATIONS:  Prior to Admission medications   Medication Sig Start Date End Date Taking? Authorizing Provider  Colchicine 0.6 MG CAPS Take 0.6 mg by mouth daily. 01/03/16   Donita BrooksWarren T Pickard, MD  febuxostat (ULORIC) 40 MG tablet Take 1 tablet (40 mg total) by mouth daily. 08/21/15   Salley ScarletKawanta F , MD  fenofibrate 160 MG tablet Take 1 tablet (160 mg total) by mouth daily. 01/03/16   Donita BrooksWarren T Pickard, MD  omeprazole (PRILOSEC) 20 MG capsule Take 20 mg by mouth daily as needed (heartburn and indigestion.).     Historical Provider, MD    ALLERGIES:  No Known Allergies  SOCIAL HISTORY:  Social History  Substance Use Topics  . Smoking status: Never Smoker   . Smokeless tobacco: Current User    Types: Chew, Snuff  . Alcohol Use: Yes     Comment: rarely drinks    FAMILY HISTORY: Family History  Problem Relation Age of Onset  . Adopted: Yes  . Heart disease Maternal Grandmother     EXAM: BP 103/80 mmHg  Pulse 97  Temp(Src) 98.4 F (36.9 C) (Oral)  Resp 18  Ht 5\' 8"  (1.727 m)  Wt 200 lb (90.719 kg)  BMI 30.42 kg/m2  SpO2 99%   CONSTITUTIONAL: Alert and oriented and responds appropriately to questions. Well-appearing; well-nourished HEAD: Normocephalic EYES: Conjunctivae  clear, PERRL ENT: normal nose; no rhinorrhea; moist mucous membranes NECK: Supple, no meningismus, no LAD  CARD: RRR; S1 and S2 appreciated; no murmurs, no clicks, no rubs, no gallops RESP: Normal chest excursion without splinting or tachypnea; breath sounds clear and equal bilaterally; no wheezes, no rhonchi, no rales, no hypoxia or respiratory distress, speaking full sentences ABD/GI: Normal bowel sounds; non-distended; soft, non-tender, no rebound, no guarding, no peritoneal signs BACK:  The back appears normal and is tender to palpation over upper-thoracic and midline spine with no stepoff  or deformity, tenderness to palpation over left thoracic paraspinal musculature with associated muscle spasms, there is no CVA tenderness EXT: Normal ROM in all joints; non-tender to palpation; no edema; normal capillary refill; no cyanosis, no calf tenderness or swelling    SKIN: Normal color for age and race; warm; no rash NEURO: Moves all extremities equally, sensation to light touch intact diffusely, cranial nerves II through XII intact, 2+ deep tendon reflexes bilateral upper and lower extremity is, no clonus, no saddle anesthesia PSYCH: The patient's mood and manner are appropriate. Grooming and personal hygiene are appropriate.  MEDICAL DECISION MAKING: Patient here with thoracic back pain and muscle spasm. No neurologic deficits. No red flag symptoms. This time I do not feel he needs emergent imaging. Doubt cauda equina, spinal stenosis, epidural abscess or hematoma, discitis or transverse myelitis. We'll treat symptomatically with Dilaudid, Toradol, Robaxin, Solu-Medrol.  ED PROGRESS: Patient reports feeling much better after pain medication. His blood pressure did drop slightly but this is after multiple sedatives. He is now able to sit up on his own and ambulate in the emergency department without any assistance. Postvoid residual is normal (0 mL). Urine shows no blood or sign of infection. I do not think this is pyelonephritis or kidney stone. I do think this is musculoskeletal in nature. He does have a PCP for close follow-up. Have recommended this pain does not improve he follow up with his PCP for a repeat MRI. I do not feel this time he needs an emergent MRI. I do not think there is any emergent neurosurgical pathology present currently. Discussed with him and his significant other at length however return precautions. Discharge with prescriptions for Dilaudid, prednisone taper, Robaxin, ibuprofen. They verbalize understanding and are comfortable with this plan.    At this time, I do not  feel there is any life-threatening condition present. I have reviewed and discussed all results (EKG, imaging, lab, urine as appropriate), exam findings with patient. I have reviewed nursing notes and appropriate previous records.  I feel the patient is safe to be discharged home without further emergent workup. Discussed usual and customary return precautions. Patient and family (if present) verbalize understanding and are comfortable with this plan.  Patient will follow-up with their primary care provider. If they do not have a primary care provider, information for follow-up has been provided to them. All questions have been answered.    I personally performed the services described in this documentation, which was scribed in my presence. The recorded information has been reviewed and is accurate.      Layla MawKristen N Kvon Mcilhenny, DO 03/14/16 0320  Layla MawKristen N Mckade Gurka, DO 03/14/16 [id number]

## 2016-03-14 NOTE — ED Notes (Signed)
Post void residual <20

## 2016-03-14 NOTE — Discharge Instructions (Signed)

## 2016-04-03 ENCOUNTER — Encounter: Payer: Self-pay | Admitting: Family Medicine

## 2016-04-03 ENCOUNTER — Ambulatory Visit (INDEPENDENT_AMBULATORY_CARE_PROVIDER_SITE_OTHER): Payer: BLUE CROSS/BLUE SHIELD | Admitting: Family Medicine

## 2016-04-03 VITALS — BP 110/90 | HR 104 | Temp 100.5°F | Resp 18 | Wt 197.0 lb

## 2016-04-03 DIAGNOSIS — Z9189 Other specified personal risk factors, not elsewhere classified: Secondary | ICD-10-CM | POA: Diagnosis not present

## 2016-04-03 MED ORDER — DOXYCYCLINE HYCLATE 100 MG PO TABS: 100.0000 mg | ORAL_TABLET | Freq: Two times a day (BID) | ORAL | Status: DC

## 2016-04-03 NOTE — Progress Notes (Signed)
Subjective:    Patient ID: Donald Estrada, male    DOB: [birth year], 35 y.o.   MRN: [id number]  HPI Patient is a very pleasant 35 year old white male who presents today complaining of a high fever for the last 48 hours. 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 He is pulled several ticks off of him this summer. Starting Wednesday he developed a fever to 102. He reports diffuse myalgias and arthralgias.  He also reports a dull headache. Other than that he has no other signs or symptoms of infection. He denies any rhinorrhea, sore throat, cough, sinus pain, otalgia, shortness of breath, nausea vomiting or diarrhea. He denies any dysuria. He denies any rash. He denies any testicular pain. Eyes any sick contacts. He has a wife and a child at home and neither are sick. Past Medical History  Diagnosis Date  . Allergy     seasonal  . Elevated LFTs   . Hypertriglyceridemia   . Prediabetes   . Gout    Past Surgical History  Procedure Laterality Date  . Inguinal hernia repair      right   Current Outpatient Prescriptions on File Prior to Visit  Medication Sig Dispense Refill  . Colchicine 0.6 MG CAPS Take 0.6 mg by mouth daily. (Patient taking differently: Take 0.6 mg by mouth daily as needed (gout). ) 30 capsule 1  . docusate sodium (COLACE) 100 MG capsule Take 1 capsule (100 mg total) by mouth every 12 (twelve) hours. 60 capsule 0  . febuxostat (ULORIC) 40 MG tablet Take 1 tablet (40 mg total) by mouth daily. 30 tablet 1  . fenofibrate 160 MG tablet Take 1 tablet (160 mg total) by mouth daily. 90 tablet 3  . ibuprofen (ADVIL,MOTRIN) 800 MG tablet Take 1 tablet (800 mg total) by mouth every 8 (eight) hours as needed for mild pain. 30 tablet 0  . methocarbamol (ROBAXIN) 500 MG tablet Take 1 tablet (500 mg total) by mouth every 8 (eight) hours as needed for muscle spasms. 20 tablet 0  . omeprazole (PRILOSEC) 20 MG capsule Take 20  mg by mouth daily as needed (heartburn and indigestion.).      No current facility-administered medications on file prior to visit.   No Known Allergies Social History   Social History  . Marital Status: Married    Spouse Name: N/A  . Number of Children: N/A  . Years of Education: N/A   Occupational History  . Not on file.   Social History Main Topics  . Smoking status: Never Smoker   . Smokeless tobacco: Current User    Types: Chew, Snuff  . Alcohol Use: Yes     Comment: rarely drinks  . Drug Use: No  . Sexual Activity: Yes    Birth Control/ Protection: Surgical   Other Topics Concern  . Not on file   Social History Narrative      Review of Systems  All other systems reviewed and are negative.      Objective:   Physical Exam  Constitutional: He is oriented to person, place, and time. He appears well-developed and well-nourished. No distress.  HENT:  Head: Normocephalic.  Right Ear: External ear normal.  Left Ear: External ear normal.  Nose: Nose normal.  Mouth/Throat: Oropharynx is clear and moist. No oropharyngeal exudate.  Eyes: Conjunctivae are normal. No scleral icterus.  Neck: Neck supple. No JVD present.  Cardiovascular: Normal rate,  regular rhythm, normal heart sounds and intact distal pulses.   No murmur heard. Pulmonary/Chest: Effort normal and breath sounds normal. No respiratory distress. He has no wheezes. He has no rales. He exhibits no tenderness.  Abdominal: Soft. Bowel sounds are normal. He exhibits no distension and no mass. There is no tenderness. There is no rebound and no guarding.  Lymphadenopathy:    He has no cervical adenopathy.  Neurological: He is alert and oriented to person, place, and time. He has normal reflexes. No cranial nerve deficit. He exhibits normal muscle tone. Coordination normal.  Skin: No rash noted. He is not diaphoretic. No erythema.  Vitals reviewed.  Exam is negative for meningismus       Assessment &  Plan:  At high risk for tick borne illness - Plan: doxycycline (VIBRA-TABS) 100 MG tablet, CBC with Differential/Platelet, COMPLETE METABOLIC PANEL WITH GFR, B. burgdorfi antibodies by WB, [facility] spotted fvr abs pnl(IgG+IgM)  Patient is high risk for tick exposure and has had several tick bites. He also has flulike symptoms in the middle of the summer time with no other viral symptoms. Therefore I'm very concerned about tick borne illness such as [facility] spotted fever. I recommended doxycycline 100 mg by mouth twice a day for 10 days while we are awaiting lab work. Meanwhile I will check Lyme titers as well as [facility] spotted fever titers in addition to a CBC and a CMP. I asked the patient to temporarily discontinue fenofibrate until he feels better. Push fluids and stay out of the heat.

## 2016-04-04 LAB — CBC WITH DIFFERENTIAL/PLATELET
BASOS PCT: 0 %
Basophils Absolute: 0 cells/uL (ref 0–200)
EOS PCT: 0 %
Eosinophils Absolute: 0 cells/uL — ABNORMAL LOW (ref 15–500)
HCT: 43.5 % (ref 38.5–50.0)
Hemoglobin: 15 g/dL (ref 13.0–17.0)
LYMPHS PCT: 15 %
Lymphs Abs: 630 cells/uL — ABNORMAL LOW (ref 850–3900)
MCH: 31.6 pg (ref 27.0–33.0)
MCHC: 34.5 g/dL (ref 32.0–36.0)
MCV: 91.6 fL (ref 80.0–100.0)
MONOS PCT: 6 %
MPV: 8.9 fL (ref 7.5–12.5)
Monocytes Absolute: 252 cells/uL (ref 200–950)
NEUTROS ABS: 3318 {cells}/uL (ref 1500–7800)
Neutrophils Relative %: 79 %
PLATELETS: 166 10*3/uL (ref 140–400)
RBC: 4.75 MIL/uL (ref 4.20–5.80)
RDW: 13 % (ref 11.0–15.0)
WBC: 4.2 10*3/uL (ref 3.8–10.8)

## 2016-04-04 LAB — COMPLETE METABOLIC PANEL WITH GFR
ALT: 89 U/L — AB (ref 9–46)
AST: 62 U/L — ABNORMAL HIGH (ref 10–40)
Albumin: 4.4 g/dL (ref 3.6–5.1)
Alkaline Phosphatase: 41 U/L (ref 40–115)
BUN: 12 mg/dL (ref 7–25)
CHLORIDE: 100 mmol/L (ref 98–110)
CO2: 25 mmol/L (ref 20–31)
CREATININE: 1.32 mg/dL (ref 0.60–1.35)
Calcium: 9.1 mg/dL (ref 8.6–10.3)
GFR, Est African American: 80 mL/min (ref >=60)
GFR, Est Non African American: 69 mL/min (ref >=60)
GLUCOSE: 112 mg/dL — AB (ref 70–99)
Potassium: 3.8 mmol/L (ref 3.5–5.3)
SODIUM: 134 mmol/L — AB (ref 135–146)
TOTAL PROTEIN: 6.5 g/dL (ref 6.1–8.1)
Total Bilirubin: 1 mg/dL (ref 0.2–1.2)

## 2016-04-08 ENCOUNTER — Telehealth: Payer: Self-pay | Admitting: Family Medicine

## 2016-04-08 LAB — ROCKY MTN SPOTTED FVR ABS PNL(IGG+IGM)
RMSF IGG: DETECTED — AB
RMSF IgM: NOT DETECTED

## 2016-04-08 LAB — REFLEX RMSF IGG TITER

## 2016-04-08 NOTE — Telephone Encounter (Signed)
Hi doctor   Patient called about his results, does not look like they have been reviewed, told him you would review and we would call him tomorrow.  [phone]

## 2016-04-10 LAB — LYME ABY, WSTRN BLT IGG & IGM W/BANDS
B BURGDORFERI IGM ABS (IB): NEGATIVE
B burgdorferi IgG Abs (IB): NEGATIVE
LYME DISEASE 23 KD IGM: NONREACTIVE
LYME DISEASE 39 KD IGM: NONREACTIVE
LYME DISEASE 41 KD IGM: NONREACTIVE
LYME DISEASE 45 KD IGG: NONREACTIVE
LYME DISEASE 58 KD IGG: NONREACTIVE
LYME DISEASE 93 KD IGG: NONREACTIVE
Lyme Disease 18 kD IgG: NONREACTIVE
Lyme Disease 23 kD IgG: NONREACTIVE
Lyme Disease 28 kD IgG: NONREACTIVE
Lyme Disease 30 kD IgG: NONREACTIVE
Lyme Disease 39 kD IgG: NONREACTIVE
Lyme Disease 41 kD IgG: NONREACTIVE
Lyme Disease 66 kD IgG: NONREACTIVE

## 2016-05-15 ENCOUNTER — Other Ambulatory Visit: Payer: BLUE CROSS/BLUE SHIELD

## 2016-05-15 DIAGNOSIS — R945 Abnormal results of liver function studies: Principal | ICD-10-CM

## 2016-05-15 DIAGNOSIS — R7989 Other specified abnormal findings of blood chemistry: Secondary | ICD-10-CM

## 2016-05-15 LAB — HEPATIC FUNCTION PANEL
ALBUMIN: 4.5 g/dL (ref 3.6–5.1)
ALT: 38 U/L (ref 9–46)
AST: 27 U/L (ref 10–40)
Alkaline Phosphatase: 35 U/L — ABNORMAL LOW (ref 40–115)
BILIRUBIN TOTAL: 0.4 mg/dL (ref 0.2–1.2)
Bilirubin, Direct: 0.1 mg/dL (ref <=0.2)
Indirect Bilirubin: 0.3 mg/dL (ref 0.2–1.2)
Total Protein: 6.6 g/dL (ref 6.1–8.1)

## 2016-06-01 ENCOUNTER — Other Ambulatory Visit: Payer: Self-pay | Admitting: Family Medicine

## 2016-06-01 NOTE — Telephone Encounter (Signed)
Patient states he has left you a message he needs his gout medication called in he said it was alprinol he uses [facility]  CB# [phone]

## 2016-06-01 NOTE — Telephone Encounter (Signed)
Allopurinol is not on his medication list so not sure what me he is referring to  - called and lmovm for pt to return call and the verify which medication he needs.

## 2016-06-02 MED ORDER — ALLOPURINOL 300 MG PO TABS: 300.0000 mg | ORAL_TABLET | Freq: Every day | ORAL | 6 refills | Status: DC

## 2016-06-02 NOTE — Telephone Encounter (Signed)
Med sent to pharm and pt aware 

## 2016-06-02 NOTE — Telephone Encounter (Signed)
Called and spoke to pt and he would like to start taking the Allopurinol and if so what mg does he need to be on? Insurance will not cover uloric until he tries allopurinol.

## 2016-12-31 ENCOUNTER — Other Ambulatory Visit: Payer: Self-pay | Admitting: Family Medicine

## 2017-01-06 ENCOUNTER — Other Ambulatory Visit: Payer: Self-pay | Admitting: Family Medicine

## 2017-07-24 ENCOUNTER — Other Ambulatory Visit: Payer: Self-pay | Admitting: Family Medicine

## 2017-09-30 ENCOUNTER — Encounter: Payer: Self-pay | Admitting: Family Medicine

## 2017-09-30 ENCOUNTER — Ambulatory Visit: Payer: BLUE CROSS/BLUE SHIELD | Admitting: Family Medicine

## 2017-09-30 VITALS — BP 120/82 | HR 64 | Temp 98.5°F | Resp 14 | Ht 71.0 in | Wt 210.0 lb

## 2017-09-30 DIAGNOSIS — M1000 Idiopathic gout, unspecified site: Secondary | ICD-10-CM | POA: Diagnosis not present

## 2017-09-30 DIAGNOSIS — Z23 Encounter for immunization: Secondary | ICD-10-CM

## 2017-09-30 DIAGNOSIS — K760 Fatty (change of) liver, not elsewhere classified: Secondary | ICD-10-CM | POA: Diagnosis not present

## 2017-09-30 DIAGNOSIS — E781 Pure hyperglyceridemia: Secondary | ICD-10-CM | POA: Diagnosis not present

## 2017-09-30 LAB — CBC WITH DIFFERENTIAL/PLATELET
Basophils Absolute: 59 cells/uL (ref 0–200)
Basophils Relative: 0.9 %
EOS PCT: 1.5 %
Eosinophils Absolute: 99 cells/uL (ref 15–500)
HEMATOCRIT: 41.6 % (ref 38.5–50.0)
Hemoglobin: 14.2 g/dL (ref 13.2–17.1)
LYMPHS ABS: 1940 {cells}/uL (ref 850–3900)
MCH: 31 pg (ref 27.0–33.0)
MCHC: 34.1 g/dL (ref 32.0–36.0)
MCV: 90.8 fL (ref 80.0–100.0)
MPV: 9.5 fL (ref 7.5–12.5)
Monocytes Relative: 8.2 %
NEUTROS PCT: 60 %
Neutro Abs: 3960 cells/uL (ref 1500–7800)
PLATELETS: 276 10*3/uL (ref 140–400)
RBC: 4.58 10*6/uL (ref 4.20–5.80)
RDW: 12.5 % (ref 11.0–15.0)
TOTAL LYMPHOCYTE: 29.4 %
WBC: 6.6 10*3/uL (ref 3.8–10.8)
WBCMIX: 541 {cells}/uL (ref 200–950)

## 2017-09-30 LAB — COMPLETE METABOLIC PANEL WITH GFR
AG RATIO: 2.1 (calc) (ref 1.0–2.5)
ALKALINE PHOSPHATASE (APISO): 47 U/L (ref 40–115)
ALT: 85 U/L — ABNORMAL HIGH (ref 9–46)
AST: 44 U/L — AB (ref 10–40)
Albumin: 4.6 g/dL (ref 3.6–5.1)
BUN: 16 mg/dL (ref 7–25)
CHLORIDE: 106 mmol/L (ref 98–110)
CO2: 27 mmol/L (ref 20–32)
Calcium: 9.7 mg/dL (ref 8.6–10.3)
Creat: 1.17 mg/dL (ref 0.60–1.35)
GFR, Est African American: 92 mL/min/{1.73_m2} (ref >=60)
GFR, Est Non African American: 80 mL/min/{1.73_m2} (ref >=60)
GLOBULIN: 2.2 g/dL (ref 1.9–3.7)
Glucose, Bld: 103 mg/dL — ABNORMAL HIGH (ref 65–99)
POTASSIUM: 4.3 mmol/L (ref 3.5–5.3)
SODIUM: 140 mmol/L (ref 135–146)
Total Bilirubin: 0.4 mg/dL (ref 0.2–1.2)
Total Protein: 6.8 g/dL (ref 6.1–8.1)

## 2017-09-30 LAB — LIPID PANEL
CHOLESTEROL: 189 mg/dL (ref <200)
HDL: 23 mg/dL — AB (ref >40)
LDL Cholesterol (Calc): 123 mg/dL (calc) — ABNORMAL HIGH
Non-HDL Cholesterol (Calc): 166 mg/dL (calc) — ABNORMAL HIGH (ref <130)
TRIGLYCERIDES: 282 mg/dL — AB (ref <150)
Total CHOL/HDL Ratio: 8.2 (calc) — ABNORMAL HIGH (ref <5.0)

## 2017-09-30 LAB — URIC ACID: Uric Acid, Serum: 3.8 mg/dL — ABNORMAL LOW (ref 4.0–8.0)

## 2017-09-30 NOTE — Progress Notes (Signed)
Subjective:    Patient ID: Donald Estrada, male    DOB: 1981-01-22, 37 y.o.   MRN: [id number]  HPI Patient has a history of dyslipidemia, hypertriglyceridemia, and gout.  Currently on fenofibrate and allopurinol.  Due for fasting lab work.  Doing well other than some mild fatigue, but he is working 70+ hrs/week.  Denies chest pain, sob, doe, fever, chills, weight loss.  Wants flu shot.  Past Medical History:  Diagnosis Date  . Allergy    seasonal  . Elevated LFTs   . Gout   . Hypertriglyceridemia   . Prediabetes    Past Surgical History:  Procedure Laterality Date  . INGUINAL HERNIA REPAIR     right   Current Outpatient Medications on File Prior to Visit  Medication Sig Dispense Refill  . allopurinol (ZYLOPRIM) 300 MG tablet TAKE 1 TABLET BY MOUTH EVERY DAY 30 tablet 6  . Colchicine 0.6 MG CAPS Take 0.6 mg by mouth daily. (Patient taking differently: Take 0.6 mg by mouth daily as needed (gout). ) 30 capsule 1  . fenofibrate 160 MG tablet TAKE 1 TABLET BY MOUTH EVERY DAY 90 tablet 2  . omeprazole (PRILOSEC) 20 MG capsule Take 20 mg by mouth daily as needed (heartburn and indigestion.).      No current facility-administered medications on file prior to visit.    No Known Allergies Social History   Socioeconomic History  . Marital status: Married    Spouse name: Not on file  . Number of children: Not on file  . Years of education: Not on file  . Highest education level: Not on file  Social Needs  . Financial resource strain: Not on file  . Food insecurity - worry: Not on file  . Food insecurity - inability: Not on file  . Transportation needs - medical: Not on file  . Transportation needs - non-medical: Not on file  Occupational History  . Not on file  Tobacco Use  . Smoking status: Never Smoker  . Smokeless tobacco: Current User    Types: Chew, Snuff  Substance and Sexual Activity  . Alcohol use: Yes    Comment: rarely drinks  . Drug use: No  . Sexual activity:  Yes    Birth control/protection: Surgical  Other Topics Concern  . Not on file  Social History Narrative  . Not on file      Review of Systems  All other systems reviewed and are negative.      Objective:   Physical Exam  Constitutional: He appears well-developed and well-nourished.  Neck: No JVD present. No thyromegaly present.  Cardiovascular: Normal rate, regular rhythm and normal heart sounds.  No murmur heard. Pulmonary/Chest: Effort normal and breath sounds normal. No respiratory distress. He has no wheezes. He has no rales.  Abdominal: Soft. Bowel sounds are normal. He exhibits no distension. There is no tenderness. There is no rebound and no guarding.  Musculoskeletal: He exhibits no edema.  Vitals reviewed.         Assessment & Plan:  Hypertriglyceridemia - Plan: CBC with Differential/Platelet, COMPLETE METABOLIC PANEL WITH GFR, Lipid panel  Fatty liver - Plan: CBC with Differential/Platelet, COMPLETE METABOLIC PANEL WITH GFR, Lipid panel  Idiopathic gout, unspecified chronicity, unspecified site - Plan: CBC with Differential/Platelet, COMPLETE METABOLIC PANEL WITH GFR, Lipid panel, Uric acid  Needs flu shot - Plan: Flu Vaccine QUAD 36+ mos IM  Check cmp, cbc, flp, and uric acid level.  Exam is normal.  BP is excellent.  Patient received flu shot.

## 2017-10-01 ENCOUNTER — Encounter: Payer: Self-pay | Admitting: Family Medicine

## 2017-10-19 ENCOUNTER — Other Ambulatory Visit: Payer: Self-pay | Admitting: Family Medicine

## 2018-01-18 ENCOUNTER — Encounter: Payer: Self-pay | Admitting: Family Medicine

## 2018-01-18 ENCOUNTER — Ambulatory Visit: Payer: BLUE CROSS/BLUE SHIELD | Admitting: Family Medicine

## 2018-01-18 VITALS — BP 120/84 | HR 72 | Temp 98.3°F | Resp 12 | Ht 71.0 in | Wt 208.0 lb

## 2018-01-18 DIAGNOSIS — M6281 Muscle weakness (generalized): Secondary | ICD-10-CM

## 2018-01-18 DIAGNOSIS — M791 Myalgia, unspecified site: Secondary | ICD-10-CM

## 2018-01-18 NOTE — Progress Notes (Signed)
Subjective:    Patient ID: Donald Estrada, male    DOB: 26-Apr-1981, 37 y.o.   MRN: [id number]  HPI Patient presents today complaining of feeling "tired all the time".  When I asked him to specify whether he is sleepy tired or physically tired, the patient states physically tired.  He denies any hypersomnolence.  He does snore excessively but his wife denies any apnea episodes.  However most recently, he states that his muscles have felt extremely weak.  Also his muscles ache throughout the day for no reason.  He denies any spasticity or loss of muscle control.  He denies any hyperreflexia or muscle fasciculations.  However he states that his legs will ache and throb throughout the day primarily in the calves.  He states that his leg muscles feel weak and tired after minimal activity.  He states that his arm muscles from his shoulders to his hands feel weak and tired with minimal activity.  The patient works in Sport and exercise psychologist.  He has bitten frequently by ticks however he denies any recent rashes.  He denies any specific joint pain.  Most concerning was Saturday night.  Patient states that he was feeling extremely tired.  Both he and his wife came home and were in the bedroom.  They were actually being intimate.  At that point he became overwhelmed with fatigue.  He simply laid down on the bed almost unable to move.  His wife states that he can barely lift his arms.  He could barely walk to the shower.  In fact the patient had to crawl into the bathroom to take a shower.  The entire time he was awake and alert and conversant.  There was no loss of consciousness.  There was no witnessed seizure activity or loss of bowel or bladder function.  Today he states that his legs are completely back to normal.  He does report easy fatigability however his muscle strength is 5/5 equal and symmetric in the upper and lower extremities today Past Medical History:  Diagnosis Date  . Allergy    seasonal  .  Elevated LFTs   . Gout   . Hypertriglyceridemia   . Prediabetes    Past Surgical History:  Procedure Laterality Date  . INGUINAL HERNIA REPAIR     right   Current Outpatient Medications on File Prior to Visit  Medication Sig Dispense Refill  . allopurinol (ZYLOPRIM) 300 MG tablet TAKE 1 TABLET BY MOUTH EVERY DAY 30 tablet 6  . Colchicine 0.6 MG CAPS Take 0.6 mg by mouth daily. (Patient taking differently: Take 0.6 mg by mouth daily as needed (gout). ) 30 capsule 1  . fenofibrate 160 MG tablet TAKE 1 TABLET BY MOUTH EVERY DAY 90 tablet 2  . omeprazole (PRILOSEC) 20 MG capsule Take 20 mg by mouth daily as needed (heartburn and indigestion.).      No current facility-administered medications on file prior to visit.    No Known Allergies Social History   Socioeconomic History  . Marital status: Married    Spouse name: Not on file  . Number of children: Not on file  . Years of education: Not on file  . Highest education level: Not on file  Occupational History  . Not on file  Social Needs  . Financial resource strain: Not on file  . Food insecurity:    Worry: Not on file    Inability: Not on file  . Transportation needs:    Medical:  Not on file    Non-medical: Not on file  Tobacco Use  . Smoking status: Never Smoker  . Smokeless tobacco: Current User    Types: Chew, Snuff  Substance and Sexual Activity  . Alcohol use: Yes    Comment: rarely drinks  . Drug use: No  . Sexual activity: Yes    Birth control/protection: Surgical  Lifestyle  . Physical activity:    Days per week: Not on file    Minutes per session: Not on file  . Stress: Not on file  Relationships  . Social connections:    Talks on phone: Not on file    Gets together: Not on file    Attends religious service: Not on file    Active member of club or organization: Not on file    Attends meetings of clubs or organizations: Not on file    Relationship status: Not on file  . Intimate partner violence:     Fear of current or ex partner: Not on file    Emotionally abused: Not on file    Physically abused: Not on file    Forced sexual activity: Not on file  Other Topics Concern  . Not on file  Social History Narrative  . Not on file      Review of Systems  All other systems reviewed and are negative.      Objective:   Physical Exam  Constitutional: He is oriented to person, place, and time. He appears well-developed and well-nourished. No distress.  HENT:  Right Ear: External ear normal.  Left Ear: External ear normal.  Nose: Nose normal.  Mouth/Throat: Oropharynx is clear and moist. No oropharyngeal exudate.  Eyes: Conjunctivae are normal. No scleral icterus.  Neck: No JVD present. No thyromegaly present.  Cardiovascular: Normal rate, regular rhythm and normal heart sounds. Exam reveals no gallop and no friction rub.  No murmur heard. Pulmonary/Chest: Effort normal and breath sounds normal. No respiratory distress. He has no wheezes. He has no rales. He exhibits no tenderness.  Abdominal: Soft. Bowel sounds are normal. He exhibits no distension and no mass. There is no tenderness. There is no rebound and no guarding.  Musculoskeletal: He exhibits no edema or tenderness.  Lymphadenopathy:    He has no cervical adenopathy.  Neurological: He is alert and oriented to person, place, and time. He has normal reflexes. He displays normal reflexes. No cranial nerve deficit. He exhibits normal muscle tone. Coordination normal.  Skin: No rash noted. He is not diaphoretic. No erythema.  Vitals reviewed.         Assessment & Plan:  Muscle weakness - Plan: CBC with Differential/Platelet, CK, COMPLETE METABOLIC PANEL WITH GFR, Magnesium, TSH, Testosterone Total,Free,Bio, Males, B. burgdorfi antibodies by WB, Sedimentation rate, Vitamin B12  Muscle pain - Plan: CBC with Differential/Platelet, CK, COMPLETE METABOLIC PANEL WITH GFR, Magnesium, TSH, Testosterone Total,Free,Bio, Males, B.  burgdorfi antibodies by WB, Sedimentation rate, Vitamin B12  History is concerning for some type of neuromuscular disorder.  First, discontinue fenofibrate immediately.  I will check lab work.  Check CK levels to evaluate for rhabdomyolysis, polymyositis.  There is no physical exam findings to suggest dermatomyositis.  Check a CMP along with a magnesium level to check for electrolyte deficiencies that can calls muscle weakness such as hypokalemia, low calcium, low magnesium.  Check TSH to evaluate for hypothyroidism.  Check sedimentation rate to evaluate for possible signs of autoimmune disease.  Check vitamin B12 level to evaluate for signs of  peripheral neuropathy.  Check testosterone levels.  If lab work is completely normal, I would recommend neurology consultation for EMG/nerve conduction studies to evaluate for neuromuscular disorders such as myasthenia gravis, multiple sclerosis, ALS, etc. consider also EEG with neurology consultation.  I am concerned that there may have been some type of partial seizure Saturday evening explaining his inability to control his extremities and his sudden loss of muscle control.

## 2018-01-19 LAB — COMPLETE METABOLIC PANEL WITH GFR
AG Ratio: 2 (calc) (ref 1.0–2.5)
ALBUMIN MSPROF: 4.9 g/dL (ref 3.6–5.1)
ALT: 68 U/L — ABNORMAL HIGH (ref 9–46)
AST: 38 U/L (ref 10–40)
Alkaline phosphatase (APISO): 50 U/L (ref 40–115)
BUN: 22 mg/dL (ref 7–25)
CO2: 26 mmol/L (ref 20–32)
CREATININE: 1.16 mg/dL (ref 0.60–1.35)
Calcium: 9.8 mg/dL (ref 8.6–10.3)
Chloride: 101 mmol/L (ref 98–110)
GFR, EST AFRICAN AMERICAN: 93 mL/min/{1.73_m2} (ref >=60)
GFR, Est Non African American: 80 mL/min/{1.73_m2} (ref >=60)
GLOBULIN: 2.4 g/dL (ref 1.9–3.7)
GLUCOSE: 97 mg/dL (ref 65–99)
Potassium: 4 mmol/L (ref 3.5–5.3)
SODIUM: 138 mmol/L (ref 135–146)
TOTAL PROTEIN: 7.3 g/dL (ref 6.1–8.1)
Total Bilirubin: 0.5 mg/dL (ref 0.2–1.2)

## 2018-01-19 LAB — CBC WITH DIFFERENTIAL/PLATELET
BASOS ABS: 63 {cells}/uL (ref 0–200)
Basophils Relative: 0.8 %
EOS PCT: 0.9 %
Eosinophils Absolute: 71 cells/uL (ref 15–500)
HEMATOCRIT: 42.3 % (ref 38.5–50.0)
HEMOGLOBIN: 14.5 g/dL (ref 13.2–17.1)
LYMPHS ABS: 2836 {cells}/uL (ref 850–3900)
MCH: 31.3 pg (ref 27.0–33.0)
MCHC: 34.3 g/dL (ref 32.0–36.0)
MCV: 91.2 fL (ref 80.0–100.0)
MPV: 9.2 fL (ref 7.5–12.5)
Monocytes Relative: 9.1 %
NEUTROS ABS: 4211 {cells}/uL (ref 1500–7800)
NEUTROS PCT: 53.3 %
Platelets: 313 10*3/uL (ref 140–400)
RBC: 4.64 10*6/uL (ref 4.20–5.80)
RDW: 12.3 % (ref 11.0–15.0)
Total Lymphocyte: 35.9 %
WBC: 7.9 10*3/uL (ref 3.8–10.8)
WBCMIX: 719 {cells}/uL (ref 200–950)

## 2018-01-19 LAB — TESTOSTERONE TOTAL,FREE,BIO, MALES
ALBUMIN MSPROF: 4.9 g/dL (ref 3.6–5.1)
SEX HORMONE BINDING: 26 nmol/L (ref 10–50)
TESTOSTERONE FREE: 48.7 pg/mL (ref 46.0–224.0)
Testosterone, Bioavailable: 108.5 ng/dL — ABNORMAL LOW (ref >=110.0)
Testosterone: 325 ng/dL (ref 250–827)

## 2018-01-19 LAB — VITAMIN B12: VITAMIN B 12: 317 pg/mL (ref 200–1100)

## 2018-01-19 LAB — MAGNESIUM: Magnesium: 2.1 mg/dL (ref 1.5–2.5)

## 2018-01-19 LAB — TSH: TSH: 1.99 mIU/L (ref 0.40–4.50)

## 2018-01-19 LAB — CK: Total CK: 58 U/L (ref 44–196)

## 2018-01-19 LAB — SEDIMENTATION RATE: SED RATE: 9 mm/h (ref 0–15)

## 2018-01-20 LAB — TEST AUTHORIZATION

## 2018-01-21 LAB — B. BURGDORFI ANTIBODIES BY WB
B BURGDORFERI IGG ABS (IB): NEGATIVE
B BURGDORFERI IGM ABS (IB): NEGATIVE
LYME DISEASE 23 KD IGG: NONREACTIVE
LYME DISEASE 28 KD IGG: NONREACTIVE
LYME DISEASE 30 KD IGG: NONREACTIVE
LYME DISEASE 41 KD IGM: NONREACTIVE
LYME DISEASE 93 KD IGG: NONREACTIVE
Lyme Disease 18 kD IgG: NONREACTIVE
Lyme Disease 23 kD IgM: NONREACTIVE
Lyme Disease 39 kD IgG: NONREACTIVE
Lyme Disease 39 kD IgM: NONREACTIVE
Lyme Disease 41 kD IgG: NONREACTIVE
Lyme Disease 45 kD IgG: NONREACTIVE
Lyme Disease 58 kD IgG: NONREACTIVE
Lyme Disease 66 kD IgG: NONREACTIVE

## 2018-01-21 LAB — PROLACTIN: Prolactin: 7.2 ng/mL (ref 2.0–18.0)

## 2018-01-24 ENCOUNTER — Other Ambulatory Visit: Payer: Self-pay | Admitting: Family Medicine

## 2018-01-24 DIAGNOSIS — M6281 Muscle weakness (generalized): Secondary | ICD-10-CM

## 2018-01-24 DIAGNOSIS — M791 Myalgia, unspecified site: Secondary | ICD-10-CM

## 2018-01-27 ENCOUNTER — Ambulatory Visit: Payer: BLUE CROSS/BLUE SHIELD | Admitting: Family Medicine

## 2018-01-27 VITALS — BP 140/94 | HR 76 | Temp 98.5°F | Resp 14 | Ht 71.0 in | Wt 208.0 lb

## 2018-01-27 DIAGNOSIS — E291 Testicular hypofunction: Secondary | ICD-10-CM

## 2018-01-27 MED ORDER — "SYRINGE 23G X 1"" 3 ML MISC": 3 refills | Status: DC

## 2018-01-27 MED ORDER — TESTOSTERONE CYPIONATE 200 MG/ML IM SOLN: 200.0000 mg | INTRAMUSCULAR | 1 refills | Status: DC

## 2018-01-27 NOTE — Progress Notes (Signed)
Subjective:    Patient ID: Donald Estrada, male    DOB: 1981/01/01, 37 y.o.   MRN: [id number]  HPI  01/18/18 Patient presents today complaining of feeling "tired all the time".  When I asked him to specify whether he is sleepy tired or physically tired, the patient states physically tired.  He denies any hypersomnolence.  He does snore excessively but his wife denies any apnea episodes.  However most recently, he states that his muscles have felt extremely weak.  Also his muscles ache throughout the day for no reason.  He denies any spasticity or loss of muscle control.  He denies any hyperreflexia or muscle fasciculations.  However he states that his legs will ache and throb throughout the day primarily in the calves.  He states that his leg muscles feel weak and tired after minimal activity.  He states that his arm muscles from his shoulders to his hands feel weak and tired with minimal activity.  The patient works in Sport and exercise psychologist.  He has bitten frequently by ticks however he denies any recent rashes.  He denies any specific joint pain.  Most concerning was Saturday night.  Patient states that he was feeling extremely tired.  Both he and his wife came home and were in the bedroom.  They were actually being intimate.  At that point he became overwhelmed with fatigue.  He simply laid down on the bed almost unable to move.  His wife states that he can barely lift his arms.  He could barely walk to the shower.  In fact the patient had to crawl into the bathroom to take a shower.  The entire time he was awake and alert and conversant.  There was no loss of consciousness.  There was no witnessed seizure activity or loss of bowel or bladder function.  Today he states that his legs are completely back to normal.  He does report easy fatigability however his muscle strength is 5/5 equal and symmetric in the upper and lower extremities today.  At that time, my plan was: History is concerning for  some type of neuromuscular disorder.  First, discontinue fenofibrate immediately.  I will check lab work.  Check CK levels to evaluate for rhabdomyolysis, polymyositis.  There is no physical exam findings to suggest dermatomyositis.  Check a CMP along with a magnesium level to check for electrolyte deficiencies that can calls muscle weakness such as hypokalemia, low calcium, low magnesium.  Check TSH to evaluate for hypothyroidism.  Check sedimentation rate to evaluate for possible signs of autoimmune disease.  Check vitamin B12 level to evaluate for signs of peripheral neuropathy.  Check testosterone levels.  If lab work is completely normal, I would recommend neurology consultation for EMG/nerve conduction studies to evaluate for neuromuscular disorders such as myasthenia gravis, multiple sclerosis, ALS, etc. consider also EEG with neurology consultation.  I am concerned that there may have been some type of partial seizure Saturday evening explaining his inability to control his extremities and his sudden loss of muscle control.  01/27/18 Patient's lab work was unremarkable except for a mild elevation in liver function tests which is chronic, borderline low testosterone with a low bioavailable testosterone, a normal prolactin level, negative sed rate, negative Lyme titers, normal B12, normal TSH, normal CBC.  Patient is here today to discuss further.  Upon further questioning, he states that he is not losing muscle control.  He believes his wife is exaggerating.  He states that he has  had 10 beers to drink that evening and he was inebriated and simply fell asleep and had to crawl to the bathroom because he was intoxicated.  His biggest concern is feeling extremely tired and fatigued.  He just feels tired all the time.  He feels like he is losing muscle strength.  He feels fatigued but not hypersomnolence. Past Medical History:  Diagnosis Date  . Allergy    seasonal  . Elevated LFTs   . Gout   .  Hypertriglyceridemia   . Prediabetes    Past Surgical History:  Procedure Laterality Date  . INGUINAL HERNIA REPAIR     right   Current Outpatient Medications on File Prior to Visit  Medication Sig Dispense Refill  . allopurinol (ZYLOPRIM) 300 MG tablet TAKE 1 TABLET BY MOUTH EVERY DAY 30 tablet 6  . Colchicine 0.6 MG CAPS Take 0.6 mg by mouth daily. (Patient taking differently: Take 0.6 mg by mouth daily as needed (gout). ) 30 capsule 1  . fenofibrate 160 MG tablet TAKE 1 TABLET BY MOUTH EVERY DAY 90 tablet 2  . omeprazole (PRILOSEC) 20 MG capsule Take 20 mg by mouth daily as needed (heartburn and indigestion.).      No current facility-administered medications on file prior to visit.    No Known Allergies Social History   Socioeconomic History  . Marital status: Married    Spouse name: Not on file  . Number of children: Not on file  . Years of education: Not on file  . Highest education level: Not on file  Occupational History  . Not on file  Social Needs  . Financial resource strain: Not on file  . Food insecurity:    Worry: Not on file    Inability: Not on file  . Transportation needs:    Medical: Not on file    Non-medical: Not on file  Tobacco Use  . Smoking status: Never Smoker  . Smokeless tobacco: Current User    Types: Chew, Snuff  Substance and Sexual Activity  . Alcohol use: Yes    Comment: rarely drinks  . Drug use: No  . Sexual activity: Yes    Birth control/protection: Surgical  Lifestyle  . Physical activity:    Days per week: Not on file    Minutes per session: Not on file  . Stress: Not on file  Relationships  . Social connections:    Talks on phone: Not on file    Gets together: Not on file    Attends religious service: Not on file    Active member of club or organization: Not on file    Attends meetings of clubs or organizations: Not on file    Relationship status: Not on file  . Intimate partner violence:    Fear of current or ex  partner: Not on file    Emotionally abused: Not on file    Physically abused: Not on file    Forced sexual activity: Not on file  Other Topics Concern  . Not on file  Social History Narrative  . Not on file      Review of Systems  All other systems reviewed and are negative.      Objective:   Physical Exam  Constitutional: He is oriented to person, place, and time. He appears well-developed and well-nourished. No distress.  HENT:  Right Ear: External ear normal.  Left Ear: External ear normal.  Nose: Nose normal.  Mouth/Throat: Oropharynx is clear and moist. No oropharyngeal  exudate.  Eyes: Conjunctivae are normal. No scleral icterus.  Neck: No JVD present. No thyromegaly present.  Cardiovascular: Normal rate, regular rhythm and normal heart sounds. Exam reveals no gallop and no friction rub.  No murmur heard. Pulmonary/Chest: Effort normal and breath sounds normal. No respiratory distress. He has no wheezes. He has no rales. He exhibits no tenderness.  Abdominal: Soft. Bowel sounds are normal. He exhibits no distension and no mass. There is no tenderness. There is no rebound and no guarding.  Musculoskeletal: He exhibits no edema or tenderness.  Lymphadenopathy:    He has no cervical adenopathy.  Neurological: He is alert and oriented to person, place, and time. He has normal reflexes. No cranial nerve deficit. He exhibits normal muscle tone. Coordination normal.  Skin: No rash noted. He is not diaphoretic. No erythema.  Vitals reviewed.         Assessment & Plan:  Hypogonadism in male Differential diagnosis includes hypogonadism causing fatigue, obstructive sleep apnea, less likely be neuromuscular disorders.  I recommended trying to treat hypogonadism with testosterone cypionate 200 mg every 2 weeks and then recheck labs in 3 months.  If the patient has not seen improvement after 3 months, I would recommend either proceeding with an evaluation for obstructive sleep  apnea as a cause of fatigue or proceeding with a neurology consultation if the concern continues to be neuromuscular disorders.  Patient is comfortable with that plan.  We did discuss the risk and benefits of testosterone replacement including an increased risk of cardiovascular disease, stroke, polycythemia, prostate cancer, and DVT.

## 2018-02-01 ENCOUNTER — Telehealth: Payer: Self-pay | Admitting: Family Medicine

## 2018-02-01 NOTE — Telephone Encounter (Signed)
PA Submitted through [email or web address] and received the following:  Request Reference Number: [id number]. TESTOST CYP INJ /ML is denied for not meeting the prior authorization requirement(s).

## 2018-02-04 MED ORDER — TESTOSTERONE 20.25 MG/ACT (1.62%) TD GEL: 2.0000 | Freq: Every day | TRANSDERMAL | 3 refills | Status: DC

## 2018-02-04 NOTE — Telephone Encounter (Signed)
Please send to pharm

## 2018-02-04 NOTE — Telephone Encounter (Signed)
Androgel 1.62% 2 pumps applied to the shoulder daily. 

## 2018-02-04 NOTE — Telephone Encounter (Signed)
Testosterone denied through insurance. Pt's wife states that the gel is covered but will require a PA as well. Can we rx him the gel????

## 2018-02-07 MED ORDER — TESTOSTERONE 20.25 MG/ACT (1.62%) TD GEL: 2.0000 | Freq: Every day | TRANSDERMAL | 3 refills | Status: DC

## 2018-02-07 NOTE — Addendum Note (Signed)
Addended by: Lynnea Ferrier T on: 02/07/2018 06:38 AM   Modules accepted: Orders

## 2018-02-15 ENCOUNTER — Telehealth: Payer: Self-pay | Admitting: *Deleted

## 2018-02-15 NOTE — Telephone Encounter (Signed)
Received request from pharmacy for PA on Androgel.   PA submitted.   Dx: E29.1- testicular hypofunction.

## 2018-02-15 NOTE — Telephone Encounter (Signed)
The answers to the authorization questions are being sent to [employer] now.

## 2018-02-15 NOTE — Telephone Encounter (Signed)
Received PA determination.   PA approved 02/15/2018- 08/18/2018.  Pharmacy made aware.

## 2018-02-26 ENCOUNTER — Other Ambulatory Visit: Payer: Self-pay | Admitting: Family Medicine

## 2018-04-25 ENCOUNTER — Telehealth: Payer: Self-pay | Admitting: Family Medicine

## 2018-04-25 NOTE — Telephone Encounter (Signed)
Patient called [facility] stating that he is no longer having the symptoms he was having at his LOV (muscle weakness) and he is scheduled to have NCS on Friday and wanted to know if he still needed to go do this?

## 2018-04-25 NOTE — Telephone Encounter (Signed)
Apt for NCS/EMG cancelled and pt would like to know if you think he need a sleep study as wife is complaining about his snoring and apnea episodes? He is doing better on the testosterone but wife thinks he should have sleep study done.

## 2018-04-25 NOTE — Telephone Encounter (Signed)
No I would cancel if he is better.

## 2018-04-26 NOTE — Telephone Encounter (Signed)
Patient aware of providers recommendations and apt made 

## 2018-04-26 NOTE — Telephone Encounter (Signed)
He is due for ov and labs after being on testosterone for 3 months (started early may).  I would like to see him to check these levels and we could discuss sleep study at that time as well.

## 2018-04-28 ENCOUNTER — Ambulatory Visit: Payer: BLUE CROSS/BLUE SHIELD | Admitting: Family Medicine

## 2018-04-28 ENCOUNTER — Encounter: Payer: Self-pay | Admitting: Family Medicine

## 2018-04-28 ENCOUNTER — Encounter: Payer: BLUE CROSS/BLUE SHIELD | Admitting: Neurology

## 2018-04-28 VITALS — BP 122/88 | HR 94 | Temp 99.2°F | Resp 14 | Ht 71.0 in | Wt 208.0 lb

## 2018-04-28 DIAGNOSIS — E291 Testicular hypofunction: Secondary | ICD-10-CM | POA: Diagnosis not present

## 2018-04-28 DIAGNOSIS — G4710 Hypersomnia, unspecified: Secondary | ICD-10-CM | POA: Diagnosis not present

## 2018-04-28 NOTE — Progress Notes (Signed)
Subjective:    Patient ID: Donald Estrada, male    DOB: 1981/01/01, 37 y.o.   MRN: [id number]  HPI  01/18/18 Patient presents today complaining of feeling "tired all the time".  When I asked him to specify whether he is sleepy tired or physically tired, the patient states physically tired.  He denies any hypersomnolence.  He does snore excessively but his wife denies any apnea episodes.  However most recently, he states that his muscles have felt extremely weak.  Also his muscles ache throughout the day for no reason.  He denies any spasticity or loss of muscle control.  He denies any hyperreflexia or muscle fasciculations.  However he states that his legs will ache and throb throughout the day primarily in the calves.  He states that his leg muscles feel weak and tired after minimal activity.  He states that his arm muscles from his shoulders to his hands feel weak and tired with minimal activity.  The patient works in [employer] and exercise psychologist.  He has bitten frequently by ticks however he denies any recent rashes.  He denies any specific joint pain.  Most concerning was Saturday night.  Patient states that he was feeling extremely tired.  Both he and his wife came home and were in the bedroom.  They were actually being intimate.  At that point he became overwhelmed with fatigue.  He simply laid down on the bed almost unable to move.  His wife states that he can barely lift his arms.  He could barely walk to the shower.  In fact the patient had to crawl into the bathroom to take a shower.  The entire time he was awake and alert and conversant.  There was no loss of consciousness.  There was no witnessed seizure activity or loss of bowel or bladder function.  Today he states that his legs are completely back to normal.  He does report easy fatigability however his muscle strength is 5/5 equal and symmetric in the upper and lower extremities today.  At that time, my plan was: History is concerning for  some type of neuromuscular disorder.  First, discontinue fenofibrate immediately.  I will check lab work.  Check CK levels to evaluate for rhabdomyolysis, polymyositis.  There is no physical exam findings to suggest dermatomyositis.  Check a CMP along with a magnesium level to check for electrolyte deficiencies that can calls muscle weakness such as hypokalemia, low calcium, low magnesium.  Check TSH to evaluate for hypothyroidism.  Check sedimentation rate to evaluate for possible signs of autoimmune disease.  Check vitamin B12 level to evaluate for signs of peripheral neuropathy.  Check testosterone levels.  If lab work is completely normal, I would recommend neurology consultation for EMG/nerve conduction studies to evaluate for neuromuscular disorders such as myasthenia gravis, multiple sclerosis, ALS, etc. consider also EEG with neurology consultation.  I am concerned that there may have been some type of partial seizure Saturday evening explaining his inability to control his extremities and his sudden loss of muscle control.  01/27/18 Patient's lab work was unremarkable except for a mild elevation in liver function tests which is chronic, borderline low testosterone with a low bioavailable testosterone, a normal prolactin level, negative sed rate, negative Lyme titers, normal B12, normal TSH, normal CBC.  Patient is here today to discuss further.  Upon further questioning, he states that he is not losing muscle control.  He believes his wife is exaggerating.  He states that he has  had 10 beers to drink that evening and he was inebriated and simply fell asleep and had to crawl to the bathroom because he was intoxicated.  His biggest concern is feeling extremely tired and fatigued.  He just feels tired all the time.  He feels like he is losing muscle strength.  He feels fatigued but not hypersomnolence.  At that time, my plan was: Differential diagnosis includes hypogonadism causing fatigue, obstructive sleep  apnea, less likely be neuromuscular disorders.  I recommended trying to treat hypogonadism with testosterone cypionate 200 mg every 2 weeks and then recheck labs in 3 months.  If the patient has not seen improvement after 3 months, I would recommend either proceeding with an evaluation for obstructive sleep apnea as a cause of fatigue or proceeding with a neurology consultation if the concern continues to be neuromuscular disorders.  Patient is comfortable with that plan.  We did discuss the risk and benefits of testosterone replacement including an increased risk of cardiovascular disease, stroke, polycythemia, prostate cancer, and DVT.  04/28/18 Patient has been on testosterone now for 3 months.  He states he feels much better.  His energy and stamina have improved dramatically.  He denies any chest pain shortness of breath dyspnea on exertion or pleurisy.  He denies any side effects on the medication which he is taking every 2 weeks in the form of testosterone cypionate 200 mg IM.  He is here today to recheck levels although from a symptomatic standpoint he feels much better.  His wife is very concerned about obstructive sleep apnea.  She states that he snores extremely loudly.  Frequently she has to sleep in another room.  She is also heard the patient have apneic episodes.  Patient does report hypersomnolence.  I performed Epworth Sleepiness Scale and the patient scored 8 indicating average sleepiness.  He reports a high chance of falling asleep after lunch, while reading, with a slight chance of falling asleep while watching TV. Past Medical History:  Diagnosis Date  . Allergy    seasonal  . Elevated LFTs   . Gout   . Hypertriglyceridemia   . Prediabetes    Past Surgical History:  Procedure Laterality Date  . INGUINAL HERNIA REPAIR     right   Current Outpatient Medications on File Prior to Visit  Medication Sig Dispense Refill  . allopurinol (ZYLOPRIM) 300 MG tablet TAKE 1 TABLET BY MOUTH  EVERY DAY 30 tablet 6  . Colchicine 0.6 MG CAPS Take 0.6 mg by mouth daily. (Patient taking differently: Take 0.6 mg by mouth daily as needed (gout). ) 30 capsule 1  . fenofibrate 160 MG tablet TAKE 1 TABLET BY MOUTH EVERY DAY 90 tablet 2  . omeprazole (PRILOSEC) 20 MG capsule Take 20 mg by mouth daily as needed (heartburn and indigestion.).     Marland Kitchen Syringe/Needle, Disp, (SYRINGE 3CC/23GX1") 23G X 1" 3 ML MISC Use to inject testosterone IM 50 each 3  . testosterone cypionate (DEPOTESTOSTERONE CYPIONATE) 200 MG/ML injection INJECT 1 ML (200 MG TOTAL) INTO THE MUSCLE EVERY 14 (FOURTEEN) DAYS.  1   No current facility-administered medications on file prior to visit.    No Known Allergies Social History   Socioeconomic History  . Marital status: Married    Spouse name: Not on file  . Number of children: Not on file  . Years of education: Not on file  . Highest education level: Not on file  Occupational History  . Not on file  Social Needs  .  Financial resource strain: Not on file  . Food insecurity:    Worry: Not on file    Inability: Not on file  . Transportation needs:    Medical: Not on file    Non-medical: Not on file  Tobacco Use  . Smoking status: Never Smoker  . Smokeless tobacco: Current User    Types: Chew, Snuff  Substance and Sexual Activity  . Alcohol use: Yes    Comment: rarely drinks  . Drug use: No  . Sexual activity: Yes    Birth control/protection: Surgical  Lifestyle  . Physical activity:    Days per week: Not on file    Minutes per session: Not on file  . Stress: Not on file  Relationships  . Social connections:    Talks on phone: Not on file    Gets together: Not on file    Attends religious service: Not on file    Active member of club or organization: Not on file    Attends meetings of clubs or organizations: Not on file    Relationship status: Not on file  . Intimate partner violence:    Fear of current or ex partner: Not on file    Emotionally  abused: Not on file    Physically abused: Not on file    Forced sexual activity: Not on file  Other Topics Concern  . Not on file  Social History Narrative  . Not on file      Review of Systems  All other systems reviewed and are negative.      Objective:   Physical Exam  Constitutional: He is oriented to person, place, and time. He appears well-developed and well-nourished. No distress.  HENT:  Right Ear: External ear normal.  Left Ear: External ear normal.  Nose: Nose normal.  Mouth/Throat: Oropharynx is clear and moist. No oropharyngeal exudate.  Eyes: Conjunctivae are normal. No scleral icterus.  Neck: No JVD present. No thyromegaly present.  Cardiovascular: Normal rate, regular rhythm and normal heart sounds. Exam reveals no gallop and no friction rub.  No murmur heard. Pulmonary/Chest: Effort normal and breath sounds normal. No respiratory distress. He has no wheezes. He has no rales. He exhibits no tenderness.  Abdominal: Soft. Bowel sounds are normal. He exhibits no distension and no mass. There is no tenderness. There is no rebound and no guarding.  Musculoskeletal: He exhibits no edema or tenderness.  Lymphadenopathy:    He has no cervical adenopathy.  Neurological: He is alert and oriented to person, place, and time. He has normal reflexes. No cranial nerve deficit. He exhibits normal muscle tone. Coordination normal.  Skin: No rash noted. He is not diaphoretic. No erythema.  Vitals reviewed.         Assessment & Plan:  Hypogonadism in male - Plan: CBC with Differential/Platelet, COMPLETE METABOLIC PANEL WITH GFR, PSA, Testosterone Total,Free,Bio, Males  Hypersomnolence - Plan: Ambulatory referral to Sleep Studies I feel the majority of his fatigue was due to hypogonadism and is symptomatically improved.  I will check a repeat testosterone level, CMP, and a CBC along with a PSA.  Monitor labs every 6 months on therapy.  Continue testosterone cypionate 200 mg  IM every 2 weeks at present.  Patient would like a referral to a sleep study based on his symptoms and his wife's concern.  Epworth sleepiness score is equivocal.  I will consult neurology for consideration of a sleep study

## 2018-04-29 ENCOUNTER — Telehealth: Payer: Self-pay | Admitting: Family Medicine

## 2018-04-29 LAB — COMPLETE METABOLIC PANEL WITH GFR
AG RATIO: 2.2 (calc) (ref 1.0–2.5)
ALT: 63 U/L — ABNORMAL HIGH (ref 9–46)
AST: 31 U/L (ref 10–40)
Albumin: 4.6 g/dL (ref 3.6–5.1)
Alkaline phosphatase (APISO): 47 U/L (ref 40–115)
BILIRUBIN TOTAL: 0.5 mg/dL (ref 0.2–1.2)
BUN/Creatinine Ratio: 12 (calc) (ref 6–22)
BUN: 18 mg/dL (ref 7–25)
CALCIUM: 10 mg/dL (ref 8.6–10.3)
CHLORIDE: 103 mmol/L (ref 98–110)
CO2: 28 mmol/L (ref 20–32)
Creat: 1.48 mg/dL — ABNORMAL HIGH (ref 0.60–1.35)
GFR, EST AFRICAN AMERICAN: 69 mL/min/{1.73_m2} (ref >=60)
GFR, EST NON AFRICAN AMERICAN: 60 mL/min/{1.73_m2} (ref >=60)
GLOBULIN: 2.1 g/dL (ref 1.9–3.7)
Glucose, Bld: 100 mg/dL — ABNORMAL HIGH (ref 65–99)
POTASSIUM: 4.2 mmol/L (ref 3.5–5.3)
SODIUM: 139 mmol/L (ref 135–146)
Total Protein: 6.7 g/dL (ref 6.1–8.1)

## 2018-04-29 LAB — CBC WITH DIFFERENTIAL/PLATELET
BASOS ABS: 84 {cells}/uL (ref 0–200)
Basophils Relative: 1 %
EOS ABS: 92 {cells}/uL (ref 15–500)
Eosinophils Relative: 1.1 %
HEMATOCRIT: 43.2 % (ref 38.5–50.0)
Hemoglobin: 15.1 g/dL (ref 13.2–17.1)
LYMPHS ABS: 2713 {cells}/uL (ref 850–3900)
MCH: 31.6 pg (ref 27.0–33.0)
MCHC: 35 g/dL (ref 32.0–36.0)
MCV: 90.4 fL (ref 80.0–100.0)
MPV: 9.4 fL (ref 7.5–12.5)
Monocytes Relative: 5.8 %
Neutro Abs: 5023 cells/uL (ref 1500–7800)
Neutrophils Relative %: 59.8 %
Platelets: 379 10*3/uL (ref 140–400)
RBC: 4.78 10*6/uL (ref 4.20–5.80)
RDW: 12.2 % (ref 11.0–15.0)
TOTAL LYMPHOCYTE: 32.3 %
WBC mixed population: 487 cells/uL (ref 200–950)
WBC: 8.4 10*3/uL (ref 3.8–10.8)

## 2018-04-29 LAB — TESTOSTERONE TOTAL,FREE,BIO, MALES
Albumin: 4.6 g/dL (ref 3.6–5.1)
Sex Hormone Binding: 26 nmol/L (ref 10–50)
TESTOSTERONE BIOAVAILABLE: 384 ng/dL (ref >=110.0)
Testosterone, Free: 182.9 pg/mL (ref 46.0–224.0)
Testosterone: 966 ng/dL — ABNORMAL HIGH (ref 250–827)

## 2018-04-29 LAB — PSA: PSA: 0.8 ng/mL (ref <=4.0)

## 2018-04-29 NOTE — Telephone Encounter (Signed)
PA Submitted through [email or web address] and received the following:  Deniedon August 8  Request Reference Number: [id number]. TESTOST CYP INJ 200MG /ML is denied for not meeting the prior authorization requirement(s). Appeals are not supported through ePA. Refer to the fax or electronic case notice for appeals information and instructions. Appeals are not supported through ePA. Please refer to the fax case notice for appeals information and instructions. Pt aware.

## 2018-05-06 ENCOUNTER — Telehealth: Payer: Self-pay | Admitting: *Deleted

## 2018-05-06 DIAGNOSIS — E291 Testicular hypofunction: Secondary | ICD-10-CM | POA: Insufficient documentation

## 2018-05-06 NOTE — Telephone Encounter (Signed)
Received request from pharmacy for PA on Testosterone Cypionate.  PA submitted.   Dx: E21.9- testicular hypofunction.  

## 2018-05-06 NOTE — Telephone Encounter (Signed)
[employer] is reviewing your PA request. Typically an electronic response will be received within 72 hours. To check for an update later, open this request from your dashboard.  You may close this dialog and return to your dashboard to perform other tasks. 

## 2018-05-09 NOTE — Telephone Encounter (Signed)
[facility] has Testosterone Cypionate 200mg /mL 10mL via $70.  Patient will need to call pharmacy for alternatives or use [facility].   Call placed to patient. No answer. VM full.

## 2018-05-09 NOTE — Telephone Encounter (Signed)
I am not sure what to tell him.  There is no alternative for hypogonadism other than to take testosterone at least that I am aware of.  If his insurance will not cover any form of testosterone he would have to pay cash.  I believe testosterone cypionate would be the cheapest cash option.  He may want to call his pharmacy and see if there are any cheaper alternatives for testosterone.  I would be happy to switch him if there is another option that would be cheaper

## 2018-05-09 NOTE — Telephone Encounter (Signed)
Received PA determination.   PA denied.   We have denied your request for Testosterone Cypionate because it is no considered a covered pharmacy benefit under the pharmacy benefit of your plan based upon the terms of your contrast.   No hormonal supplementation will be covered as insurance plan does not cover such medications.   MD please advise.

## 2018-05-10 NOTE — Telephone Encounter (Signed)
Call placed to patient. No answer. VM full. 

## 2018-05-12 NOTE — Telephone Encounter (Signed)
Multiple calls placed to patient with no answer and no return call.   Message to be closed.  

## 2018-07-27 ENCOUNTER — Other Ambulatory Visit: Payer: Self-pay | Admitting: Family Medicine

## 2018-07-27 NOTE — Telephone Encounter (Signed)
Ok to refill 

## 2018-08-26 ENCOUNTER — Other Ambulatory Visit: Payer: Self-pay | Admitting: Family Medicine

## 2018-08-26 NOTE — Telephone Encounter (Signed)
Ok to refill??  Last office visit 04/28/2018.  Last refill 07/28/2018.

## 2018-08-30 ENCOUNTER — Other Ambulatory Visit: Payer: Self-pay | Admitting: Family Medicine

## 2018-09-12 ENCOUNTER — Other Ambulatory Visit: Payer: Self-pay | Admitting: Family Medicine

## 2018-09-19 ENCOUNTER — Other Ambulatory Visit: Payer: Self-pay | Admitting: Family Medicine

## 2018-09-20 NOTE — Telephone Encounter (Signed)
Ok to refill 

## 2018-10-22 ENCOUNTER — Other Ambulatory Visit: Payer: Self-pay | Admitting: Family Medicine

## 2018-10-24 NOTE — Telephone Encounter (Signed)
Ok to refill 

## 2018-12-12 ENCOUNTER — Other Ambulatory Visit: Payer: Self-pay | Admitting: Family Medicine

## 2018-12-12 ENCOUNTER — Telehealth: Payer: Self-pay | Admitting: *Deleted

## 2018-12-12 NOTE — Telephone Encounter (Signed)
Your information has been sent to [employer]. 

## 2018-12-12 NOTE — Telephone Encounter (Signed)
Received request from pharmacy for PA on Testosterone Cypionate.  PA submitted.   Dx: E21.9- testicular hypofunction.

## 2018-12-12 NOTE — Telephone Encounter (Signed)
Ok to refill??  Last office visit 04/28/2018.  Last refill 10/24/2018.

## 2018-12-13 NOTE — Telephone Encounter (Signed)
Received fax requesting further information regarding PA. Faxed information to insurance.   

## 2018-12-15 NOTE — Telephone Encounter (Signed)
Received PA determination.   PA denied.   Patient made aware.

## 2018-12-16 NOTE — Telephone Encounter (Signed)
Per patient, he is paying out of pocket $26 for 2mL (30 days supply)  Advised that [facility] has $70 for 17mL (150 day supply).

## 2019-01-07 ENCOUNTER — Other Ambulatory Visit: Payer: Self-pay | Admitting: Family Medicine

## 2019-01-09 NOTE — Telephone Encounter (Signed)
Ok to refill??  Last office visit 04/28/2018.  Last refill 12/12/2018.

## 2019-02-14 ENCOUNTER — Other Ambulatory Visit: Payer: Self-pay | Admitting: Family Medicine

## 2019-02-15 NOTE — Telephone Encounter (Signed)
Requesting refill    Testosterone   LOV: 04/28/18  LRF:  01/09/19

## 2019-02-16 NOTE — Telephone Encounter (Signed)
Due for ov and labs

## 2019-02-27 ENCOUNTER — Encounter: Payer: Self-pay | Admitting: Family Medicine

## 2019-02-27 NOTE — Telephone Encounter (Signed)
Letter sent.

## 2019-03-08 ENCOUNTER — Telehealth: Payer: Self-pay

## 2019-03-08 NOTE — Telephone Encounter (Signed)
PA submitted via CMM for Testosterone Cypionate 200MG /ML intramuscular solution. PA was approved through 03/07/2020. Pharmacy notified.

## 2019-03-09 ENCOUNTER — Other Ambulatory Visit: Payer: Self-pay

## 2019-03-09 ENCOUNTER — Encounter: Payer: Self-pay | Admitting: Family Medicine

## 2019-03-09 ENCOUNTER — Ambulatory Visit (INDEPENDENT_AMBULATORY_CARE_PROVIDER_SITE_OTHER): Payer: BC Managed Care – PPO | Admitting: Family Medicine

## 2019-03-09 VITALS — BP 130/74 | HR 78 | Temp 98.3°F | Resp 14 | Ht 71.0 in | Wt 213.0 lb

## 2019-03-09 DIAGNOSIS — E291 Testicular hypofunction: Secondary | ICD-10-CM

## 2019-03-09 DIAGNOSIS — Z13220 Encounter for screening for lipoid disorders: Secondary | ICD-10-CM | POA: Diagnosis not present

## 2019-03-09 NOTE — Progress Notes (Signed)
Subjective:    Patient ID: Donald Estrada, male    DOB: 19-Sep-1981, 38 y.o.   MRN: [id number]  HPI  01/18/18 Patient presents today complaining of feeling "tired all the time".  When I asked him to specify whether he is sleepy tired or physically tired, the patient states physically tired.  He denies any hypersomnolence.  He does snore excessively but his wife denies any apnea episodes.  However most recently, he states that his muscles have felt extremely weak.  Also his muscles ache throughout the day for no reason.  He denies any spasticity or loss of muscle control.  He denies any hyperreflexia or muscle fasciculations.  However he states that his legs will ache and throb throughout the day primarily in the calves.  He states that his leg muscles feel weak and tired after minimal activity.  He states that his arm muscles from his shoulders to his hands feel weak and tired with minimal activity.  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  He has bitten frequently by ticks however he denies any recent rashes.  He denies any specific joint pain.  Most concerning was Saturday night.  Patient states that he was feeling extremely tired.  Both he and his wife came home and were in the bedroom.  They were actually being intimate.  At that point he became overwhelmed with fatigue.  He simply laid down on the bed almost unable to move.  His wife states that he can barely lift his arms.  He could barely walk to the shower.  In fact the patient had to crawl into the bathroom to take a shower.  The entire time he was awake and alert and conversant.  There was no loss of consciousness.  There was no witnessed seizure activity or loss of bowel or bladder function.  Today he states that his legs are completely back to normal.  He does report easy fatigability however his muscle strength is 5/5 equal and symmetric in the upper and lower extremities today.  At that time, my plan was: History is concerning for  some type of neuromuscular disorder.  First, discontinue fenofibrate immediately.  I will check lab work.  Check CK levels to evaluate for rhabdomyolysis, polymyositis.  There is no physical exam findings to suggest dermatomyositis.  Check a CMP along with a magnesium level to check for electrolyte deficiencies that can calls muscle weakness such as hypokalemia, low calcium, low magnesium.  Check TSH to evaluate for hypothyroidism.  Check sedimentation rate to evaluate for possible signs of autoimmune disease.  Check vitamin B12 level to evaluate for signs of peripheral neuropathy.  Check testosterone levels.  If lab work is completely normal, I would recommend neurology consultation for EMG/nerve conduction studies to evaluate for neuromuscular disorders such as myasthenia gravis, multiple sclerosis, ALS, etc. consider also EEG with neurology consultation.  I am concerned that there may have been some type of partial seizure Saturday evening explaining his inability to control his extremities and his sudden loss of muscle control.  01/27/18 Patient's lab work was unremarkable except for a mild elevation in liver function tests which is chronic, borderline low testosterone with a low bioavailable testosterone, a normal prolactin level, negative sed rate, negative Lyme titers, normal B12, normal TSH, normal CBC.  Patient is here today to discuss further.  Upon further questioning, he states that he is not losing muscle control.  He believes his wife is exaggerating.  He states that he has  had 10 beers to drink that evening and he was inebriated and simply fell asleep and had to crawl to the bathroom because he was intoxicated.  His biggest concern is feeling extremely tired and fatigued.  He just feels tired all the time.  He feels like he is losing muscle strength.  He feels fatigued but not hypersomnolence.  At that time, my plan was: Differential diagnosis includes hypogonadism causing fatigue, obstructive sleep  apnea, less likely be neuromuscular disorders.  I recommended trying to treat hypogonadism with testosterone cypionate 200 mg every 2 weeks and then recheck labs in 3 months.  If the patient has not seen improvement after 3 months, I would recommend either proceeding with an evaluation for obstructive sleep apnea as a cause of fatigue or proceeding with a neurology consultation if the concern continues to be neuromuscular disorders.  Patient is comfortable with that plan.  We did discuss the risk and benefits of testosterone replacement including an increased risk of cardiovascular disease, stroke, polycythemia, prostate cancer, and DVT.  04/28/18 Patient has been on testosterone now for 3 months.  He states he feels much better.  His energy and stamina have improved dramatically.  He denies any chest pain shortness of breath dyspnea on exertion or pleurisy.  He denies any side effects on the medication which he is taking every 2 weeks in the form of testosterone cypionate 200 mg IM.  He is here today to recheck levels although from a symptomatic standpoint he feels much better.  His wife is very concerned about obstructive sleep apnea.  She states that he snores extremely loudly.  Frequently she has to sleep in another room.  She is also heard the patient have apneic episodes.  Patient does report hypersomnolence.  I performed Epworth Sleepiness Scale and the patient scored 8 indicating average sleepiness.  He reports a high chance of falling asleep after lunch, while reading, with a slight chance of falling asleep while watching TV.  At that time, my plan was: I feel the majority of his fatigue was due to hypogonadism and is symptomatically improved.  I will check a repeat testosterone level, CMP, and a CBC along with a PSA.  Monitor labs every 6 months on therapy.  Continue testosterone cypionate 200 mg IM every 2 weeks at present.  Patient would like a referral to a sleep study based on his symptoms and his  wife's concern.  Epworth sleepiness score is equivocal.  I will consult neurology for consideration of a sleep study  03/09/19 Patient is here today for a follow-up of his hypogonadism.  He is due for fasting lab work.  He is 1 week after his last testosterone injection.  Therefore he is at the midpoint of his cycle.  He states that as long as he takes the testosterone his energy levels have improved.  However if he misses the testosterone as he has recently he starts to feel like "crap".  He also states that he is under more stress recently.  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  The stress causes him not to sleep well at night occasionally.  He also feels like he is losing his temper more quickly.  However he denies any depression or anhedonia or panic attacks or suicidal ideation.  He denies any chest pain shortness of breath or dyspnea on exertion.  Blood pressure today is well controlled at 130/74 Past Medical History:  Diagnosis  Date  . Allergy    seasonal  . Elevated LFTs   . Gout   . Hypertriglyceridemia   . Prediabetes    Past Surgical History:  Procedure Laterality Date  . INGUINAL HERNIA REPAIR     right   Current Outpatient Medications on File Prior to Visit  Medication Sig Dispense Refill  . allopurinol (ZYLOPRIM) 300 MG tablet TAKE 1 TABLET BY MOUTH EVERY DAY 30 tablet 6  . Colchicine 0.6 MG CAPS Take 0.6 mg by mouth daily. (Patient taking differently: Take 0.6 mg by mouth daily as needed (gout). ) 30 capsule 1  . fenofibrate 160 MG tablet TAKE 1 TABLET BY MOUTH EVERY DAY 30 tablet 8  . omeprazole (PRILOSEC) 20 MG capsule Take 20 mg by mouth daily as needed (heartburn and indigestion.).     Marland Kitchen. SYRINGE-NEEDLE, DISP, 3 ML (B-D 3CC LUER-LOK SYR 23GX1") 23G X 1" 3 ML MISC USE TO INJECT TESTOSTERONE IM 50 each 2  . testosterone cypionate (DEPOTESTOSTERONE CYPIONATE) 200 MG/ML injection INJECT 1 ML (200 MG TOTAL) INTO THE  MUSCLE EVERY 14 (FOURTEEN) DAYS.  1  . testosterone cypionate (DEPOTESTOSTERONE CYPIONATE) 200 MG/ML injection INJECT 1 MILLILITER INTO THE MUSCLE EVERY 14 DAYS 2 mL 3   No current facility-administered medications on file prior to visit.    No Known Allergies Social History   Socioeconomic History  . Marital status: Married    Spouse name: Not on file  . Number of children: Not on file  . Years of education: Not on file  . Highest education level: Not on file  Occupational History  . Not on file  Social Needs  . Financial resource strain: Not on file  . Food insecurity    Worry: Not on file    Inability: Not on file  . Transportation needs    Medical: Not on file    Non-medical: Not on file  Tobacco Use  . Smoking status: Never Smoker  . Smokeless tobacco: Current User    Types: Chew, Snuff  Substance and Sexual Activity  . Alcohol use: Yes    Comment: rarely drinks  . Drug use: No  . Sexual activity: Yes    Birth control/protection: Surgical  Lifestyle  . Physical activity    Days per week: Not on file    Minutes per session: Not on file  . Stress: Not on file  Relationships  . Social Musicianconnections    Talks on phone: Not on file    Gets together: Not on file    Attends religious service: Not on file    Active member of club or organization: Not on file    Attends meetings of clubs or organizations: Not on file    Relationship status: Not on file  . Intimate partner violence    Fear of current or ex partner: Not on file    Emotionally abused: Not on file    Physically abused: Not on file    Forced sexual activity: Not on file  Other Topics Concern  . Not on file  Social History Narrative  . Not on file      Review of Systems  All other systems reviewed and are negative.      Objective:   Physical Exam  Constitutional: He is oriented to person, place, and time. He appears well-developed and well-nourished. No distress.  HENT:  Right Ear: External ear  normal.  Left Ear: External ear normal.  Nose: Nose normal.  Mouth/Throat: Oropharynx is  clear and moist. No oropharyngeal exudate.  Eyes: Conjunctivae are normal. No scleral icterus.  Neck: No JVD present. No thyromegaly present.  Cardiovascular: Normal rate, regular rhythm and normal heart sounds. Exam reveals no gallop and no friction rub.  No murmur heard. Pulmonary/Chest: Effort normal and breath sounds normal. No respiratory distress. He has no wheezes. He has no rales. He exhibits no tenderness.  Abdominal: Soft. Bowel sounds are normal. He exhibits no distension and no mass. There is no abdominal tenderness. There is no rebound and no guarding.  Musculoskeletal:        General: No tenderness or edema.  Lymphadenopathy:    He has no cervical adenopathy.  Neurological: He is alert and oriented to person, place, and time. He has normal reflexes. No cranial nerve deficit. He exhibits normal muscle tone. Coordination normal.  Skin: No rash noted. He is not diaphoretic. No erythema.  Vitals reviewed.         Assessment & Plan:  1. Hypogonadism in male Pertaining to his hypogonadism I will check a CBC, CMP, fasting lipid panel, PSA, and testosterone level.  If his lab work is normal, we will make no adjustments in his medication as symptomatically the patient feels well.  We spent a fair amount of time discussing his stress at work.  At the present time I do not feel that he meets clinical criteria for depression or anxiety disorder or panic attacks.  However if the symptoms worsen, we did discuss medication options to help manage stress - CBC with Differential/Platelet - COMPLETE METABOLIC PANEL WITH GFR - Lipid panel - PSA - Testosterone Total,Free,Bio, Males

## 2019-03-10 LAB — TESTOSTERONE TOTAL,FREE,BIO, MALES
Albumin: 4.7 g/dL (ref 3.6–5.1)
Sex Hormone Binding: 22 nmol/L (ref 10–50)
Testosterone, Bioavailable: 764.4 ng/dL — ABNORMAL HIGH (ref >=110.0)
Testosterone, Free: 356.6 pg/mL — ABNORMAL HIGH (ref 46.0–224.0)
Testosterone: 1466 ng/dL — ABNORMAL HIGH (ref 250–827)

## 2019-03-10 LAB — COMPLETE METABOLIC PANEL WITH GFR
AG Ratio: 2.1 (calc) (ref 1.0–2.5)
ALT: 67 U/L — ABNORMAL HIGH (ref 9–46)
AST: 37 U/L (ref 10–40)
Albumin: 4.7 g/dL (ref 3.6–5.1)
Alkaline phosphatase (APISO): 34 U/L — ABNORMAL LOW (ref 36–130)
BUN: 17 mg/dL (ref 7–25)
CO2: 26 mmol/L (ref 20–32)
Calcium: 10 mg/dL (ref 8.6–10.3)
Chloride: 104 mmol/L (ref 98–110)
Creat: 1.27 mg/dL (ref 0.60–1.35)
GFR, Est African American: 83 mL/min/{1.73_m2} (ref >=60)
GFR, Est Non African American: 71 mL/min/{1.73_m2} (ref >=60)
Globulin: 2.2 g/dL (calc) (ref 1.9–3.7)
Glucose, Bld: 102 mg/dL — ABNORMAL HIGH (ref 65–99)
Potassium: 4.7 mmol/L (ref 3.5–5.3)
Sodium: 140 mmol/L (ref 135–146)
Total Bilirubin: 0.4 mg/dL (ref 0.2–1.2)
Total Protein: 6.9 g/dL (ref 6.1–8.1)

## 2019-03-10 LAB — LIPID PANEL
Cholesterol: 195 mg/dL (ref <200)
HDL: 25 mg/dL — ABNORMAL LOW (ref >=40)
LDL Cholesterol (Calc): 127 mg/dL (calc) — ABNORMAL HIGH
Non-HDL Cholesterol (Calc): 170 mg/dL (calc) — ABNORMAL HIGH (ref <130)
Total CHOL/HDL Ratio: 7.8 (calc) — ABNORMAL HIGH (ref <5.0)
Triglycerides: 279 mg/dL — ABNORMAL HIGH (ref <150)

## 2019-03-10 LAB — CBC WITH DIFFERENTIAL/PLATELET
Absolute Monocytes: 481 cells/uL (ref 200–950)
Basophils Absolute: 41 cells/uL (ref 0–200)
Basophils Relative: 0.7 %
Eosinophils Absolute: 70 cells/uL (ref 15–500)
Eosinophils Relative: 1.2 %
HCT: 45.1 % (ref 38.5–50.0)
Hemoglobin: 15.5 g/dL (ref 13.2–17.1)
Lymphs Abs: 1862 cells/uL (ref 850–3900)
MCH: 32.2 pg (ref 27.0–33.0)
MCHC: 34.4 g/dL (ref 32.0–36.0)
MCV: 93.6 fL (ref 80.0–100.0)
MPV: 9.9 fL (ref 7.5–12.5)
Monocytes Relative: 8.3 %
Neutro Abs: 3347 cells/uL (ref 1500–7800)
Neutrophils Relative %: 57.7 %
Platelets: 302 10*3/uL (ref 140–400)
RBC: 4.82 10*6/uL (ref 4.20–5.80)
RDW: 12.3 % (ref 11.0–15.0)
Total Lymphocyte: 32.1 %
WBC: 5.8 10*3/uL (ref 3.8–10.8)

## 2019-03-10 LAB — PSA: PSA: 0.8 ng/mL (ref <=4.0)

## 2019-06-04 ENCOUNTER — Other Ambulatory Visit: Payer: Self-pay | Admitting: Family Medicine

## 2019-06-11 ENCOUNTER — Other Ambulatory Visit: Payer: Self-pay | Admitting: Family Medicine

## 2019-06-28 ENCOUNTER — Other Ambulatory Visit: Payer: Self-pay | Admitting: Family Medicine

## 2019-06-28 DIAGNOSIS — G4710 Hypersomnia, unspecified: Secondary | ICD-10-CM

## 2019-06-30 ENCOUNTER — Other Ambulatory Visit: Payer: Self-pay | Admitting: Family Medicine

## 2019-06-30 NOTE — Telephone Encounter (Signed)
Ok to refill??  Last office visit 03/09/2019  Last refill 02/16/2019, #2 refills.

## 2019-07-04 ENCOUNTER — Ambulatory Visit: Payer: BC Managed Care – PPO | Admitting: Neurology

## 2019-07-04 ENCOUNTER — Encounter: Payer: Self-pay | Admitting: Neurology

## 2019-07-04 ENCOUNTER — Other Ambulatory Visit: Payer: Self-pay

## 2019-07-04 VITALS — BP 136/89 | HR 74 | Temp 97.8°F | Ht 70.0 in | Wt 211.0 lb

## 2019-07-04 DIAGNOSIS — R0681 Apnea, not elsewhere classified: Secondary | ICD-10-CM | POA: Diagnosis not present

## 2019-07-04 DIAGNOSIS — G4719 Other hypersomnia: Secondary | ICD-10-CM

## 2019-07-04 DIAGNOSIS — R0683 Snoring: Secondary | ICD-10-CM

## 2019-07-04 DIAGNOSIS — E669 Obesity, unspecified: Secondary | ICD-10-CM

## 2019-07-04 DIAGNOSIS — R351 Nocturia: Secondary | ICD-10-CM

## 2019-07-04 NOTE — Patient Instructions (Signed)

## 2019-07-04 NOTE — Progress Notes (Signed)
Subjective:    Patient ID: Donald Estrada is a 38 y.o. male.  HPI     Huston FoleySaima Navarro Nine, MD, PhD [facility] [address], [address] [address] [place], [place] [address]  Dear Dr. Tanya NonesPickard, I saw your patient, Donald SahaBrandon Estala, upon your kind request to my sleep clinic today for initial consultation of his sleep disorder, in particular, concern for underlying obstructive sleep apnea.  The patient is unaccompanied today.  As you know, Mr. Roena Maladyulliam is a 38 year old right-handed gentleman with an underlying medical history of hypogonadism, hypertriglyceridemia, elevated LFTs, gout, allergies, prediabetes and borderline obesity, who reports snoring and excessive daytime somnolence.  I reviewed your office note from 03/09/2019.  His Epworth sleepiness score is 16 out of 24, fatigue severity score is 57 out of 63.  He does tree work.  He tries to get enough sleep, typically is in bed by 930 and rise time is around 6.  He has nocturia once around 3 AM.  His wife is a Engineer, civil (consulting)nurse and has noticed apneic pauses while he is asleep and snoring can be loud.  He has been using a dentist made mouth appliance for the past 2 years but finds it uncomfortable and often takes it out inadvertently in the middle of the night.  It does help his snoring and his wife sleeps better when he uses the dental device.  His mother-in-law helped getting the dental device as she works at a Training and development officerdentist office.  He has gained a little bit of weight in the past year.  He is not aware of any family history of OSA.  He denies recurrent morning headaches.  He lives with his wife and 38 year old son who is a sophomore in high school.  They have 2 dogs in the household, they do not sleep in his bedroom.  Patient is a non-smoker but does use smokeless tobacco.  He has caffeine in the form of diet Pepsi, 2 cans/day on average.  He drinks alcohol rarely.  His Past Medical History Is Significant For: Past Medical History:  Diagnosis Date   . Allergy    seasonal  . Elevated LFTs   . Gout   . Hypertriglyceridemia   . Prediabetes     His Past Surgical History Is Significant For: Past Surgical History:  Procedure Laterality Date  . INGUINAL HERNIA REPAIR     right    His Family History Is Significant For: Family History  Adopted: Yes  Problem Relation Age of Onset  . Heart disease Maternal Grandmother     His Social History Is Significant For: Social History   Socioeconomic History  . Marital status: Married    Spouse name: Not on file  . Number of children: Not on file  . Years of education: Not on file  . Highest education level: Not on file  Occupational History  . Not on file  Social Needs  . Financial resource strain: Not on file  . Food insecurity    Worry: Not on file    Inability: Not on file  . Transportation needs    Medical: Not on file    Non-medical: Not on file  Tobacco Use  . Smoking status: Never Smoker  . Smokeless tobacco: Current User    Types: Chew, Snuff  Substance and Sexual Activity  . Alcohol use: Yes    Comment: rarely drinks  . Drug use: No  . Sexual activity: Yes    Birth control/protection: Surgical  Lifestyle  . Physical  activity    Days per week: Not on file    Minutes per session: Not on file  . Stress: Not on file  Relationships  . Social Musician on phone: Not on file    Gets together: Not on file    Attends religious service: Not on file    Active member of club or organization: Not on file    Attends meetings of clubs or organizations: Not on file    Relationship status: Not on file  Other Topics Concern  . Not on file  Social History Narrative  . Not on file    His Allergies Are:  No Known Allergies:   His Current Medications Are:  Outpatient Encounter Medications as of 07/04/2019  Medication Sig  . allopurinol (ZYLOPRIM) 300 MG tablet TAKE 1 TABLET BY MOUTH EVERY DAY  . Colchicine 0.6 MG CAPS Take 0.6 mg by mouth daily. (Patient  taking differently: Take 0.6 mg by mouth daily as needed (gout). )  . fenofibrate 160 MG tablet TAKE 1 TABLET BY MOUTH EVERY DAY  . omeprazole (PRILOSEC) 20 MG capsule Take 20 mg by mouth daily as needed (heartburn and indigestion.).   Marland Kitchen SYRINGE-NEEDLE, DISP, 3 ML (B-D 3CC LUER-LOK SYR 23GX1") 23G X 1" 3 ML MISC USE TO INJECT TESTOSTERONE IM  . testosterone cypionate (DEPOTESTOSTERONE CYPIONATE) 200 MG/ML injection INJECT 1 ML (200 MG TOTAL) INTO THE MUSCLE EVERY 14 (FOURTEEN) DAYS.  Marland Kitchen testosterone cypionate (DEPOTESTOSTERONE CYPIONATE) 200 MG/ML injection INJECT 1 ML INTO THE MUSCLE EVERY 14 DAYS   No facility-administered encounter medications on file as of 07/04/2019.   :  Review of Systems:  Out of a complete 14 point review of systems, all are reviewed and negative with the exception of these symptoms as listed below: Review of Systems  Neurological:       Pt presents today to discuss his sleep. Pt has never had a sleep study but does endorse snoring.  Epworth Sleepiness Scale 0= would never doze 1= slight chance of dozing 2= moderate chance of dozing 3= high chance of dozing  Sitting and reading: 2 Watching TV: 3 Sitting inactive in a public place (ex. Theater or meeting): 1 As a passenger in a car for an hour without a break: 2 Lying down to rest in the afternoon: 3 Sitting and talking to someone: 1 Sitting quietly after lunch (no alcohol): 3 In a car, while stopped in traffic: 1 Total: 16      Objective:  Neurological Exam  Physical Exam Physical Examination:   Vitals:   07/04/19 0856  BP: 136/89  Pulse: 74  Temp: 97.8 F (36.6 C)    General Examination: The patient is a very pleasant 38 y.o. male in no acute distress. He appears well-developed and well-nourished and well groomed.   HEENT: Normocephalic, atraumatic, pupils are equal, round and reactive to light. Extraocular tracking is good without limitation to gaze excursion or nystagmus noted. Normal  smooth pursuit is noted. Hearing is grossly intact. Face is symmetric with normal facial animation. Speech is clear with no dysarthria noted. There is no hypophonia. There is no lip, neck/head, jaw or voice tremor. Neck is supple with full range of passive and active motion. There are no carotid bruits on auscultation. Oropharynx exam reveals: no  mouth dryness, good dental hygiene and moderate airway crowding, due to Small airway entry and larger uvula, tonsils are small, Mallampati is class II, neck circumference 17 inches, he has no significant  or minimal overbite.  Tongue protrudes centrally in palate elevates symmetrically.  Chest: Clear to auscultation without wheezing, rhonchi or crackles noted.  Heart: S1+S2+0, regular and normal without murmurs, rubs or gallops noted.   Abdomen: Soft, non-tender and non-distended with normal bowel sounds appreciated on auscultation.  Extremities: There is no pitting edema in the distal lower extremities bilaterally.  Skin: Warm and dry without trophic changes note.  Musculoskeletal: exam reveals no obvious joint deformities, tenderness or joint swelling or erythema.   Neurologically:  Mental status: The patient is awake, alert and oriented in all 4 spheres. His immediate and remote memory, attention, language skills and fund of knowledge are appropriate. There is no evidence of aphasia, agnosia, apraxia or anomia. Speech is clear with normal prosody and enunciation. Thought process is linear. Mood is normal and affect is normal.  Cranial nerves II - XII are as described above under HEENT exam. In addition: shoulder shrug is normal with equal shoulder height noted. Motor exam: Normal bulk, strength and tone is noted. There is no drift, tremor or rebound. Romberg is negative. Fine motor skills and coordination: intact with normal finger taps, normal hand movements, normal rapid alternating patting, normal foot taps and normal foot agility.  Cerebellar  testing: No dysmetria or intention tremor on finger to nose testing. Heel to shin is unremarkable bilaterally. There is no truncal or gait ataxia.  Sensory exam: intact to light touch in the upper and lower extremities.  Gait, station and balance: He stands easily. No veering to one side is noted. No leaning to one side is noted. Posture is age-appropriate and stance is narrow based. Gait shows normal stride length and normal pace. No problems turning are noted. Tandem walk is unremarkable.   Assessment and Plan:  In summary, Jahni Nazar is a very pleasant 38 y.o.-year old male with an underlying medical history of hypogonadism, hypertriglyceridemia, elevated LFTs, gout, allergies, prediabetes and borderline obesity, whose history and physical exam  Are concerning for obstructive sleep apnea (OSA). I had a long chat with the patient about my findings and the diagnosis of OSA, its prognosis and treatment options. We talked about medical treatments, surgical interventions and non-pharmacological approaches. I explained in particular the risks and ramifications of untreated moderate to severe OSA, especially with respect to developing cardiovascular disease down the Road, including congestive heart failure, difficult to treat hypertension, cardiac arrhythmias, or stroke. Even type 2 diabetes has, in part, been linked to untreated OSA. Symptoms of untreated OSA include daytime sleepiness, memory problems, mood irritability and mood disorder such as depression and anxiety, lack of energy, as well as recurrent headaches, especially morning headaches. We talked about nicotine/tobacco cessation and trying to maintain a healthy lifestyle in general, as well as the importance of weight control. We also talked about the importance of good sleep hygiene. I recommended the following at this time: sleep study.   I explained the sleep test procedure to the patient and also outlined possible surgical and non-surgical  treatment options of OSA, including the use of a custom-made dental device (which would require a referral to a specialist dentist or oral surgeon), upper airway surgical options, such as traditional UPPP or a novel less invasive surgical option in the form of Inspire hypoglossal nerve stimulation (which would involve a referral to an ENT surgeon). I also explained the CPAP treatment option to the patient, who indicated that he would be willing to try CPAP if the need arises. I explained the importance of being  compliant with PAP treatment, not only for insurance purposes but primarily to improve His symptoms, and for the patient's long term health benefit, including to reduce His cardiovascular risks. I answered all his questions today and the patient was in agreement. I plan to see him back after the sleep study is completed and encouraged him to call with any interim questions, concerns, problems or updates.   Thank you very much for allowing me to participate in the care of this nice patient. If I can be of any further assistance to you please do not hesitate to call me at [phone].  Sincerely,   Huston Foley, MD, PhD

## 2019-07-28 ENCOUNTER — Ambulatory Visit (INDEPENDENT_AMBULATORY_CARE_PROVIDER_SITE_OTHER): Payer: BC Managed Care – PPO

## 2019-07-28 ENCOUNTER — Other Ambulatory Visit: Payer: Self-pay

## 2019-07-28 DIAGNOSIS — Z23 Encounter for immunization: Secondary | ICD-10-CM

## 2019-08-21 ENCOUNTER — Ambulatory Visit (INDEPENDENT_AMBULATORY_CARE_PROVIDER_SITE_OTHER): Payer: BC Managed Care – PPO | Admitting: Neurology

## 2019-08-21 DIAGNOSIS — G4719 Other hypersomnia: Secondary | ICD-10-CM

## 2019-08-21 DIAGNOSIS — R351 Nocturia: Secondary | ICD-10-CM

## 2019-08-21 DIAGNOSIS — R0681 Apnea, not elsewhere classified: Secondary | ICD-10-CM

## 2019-08-21 DIAGNOSIS — G4733 Obstructive sleep apnea (adult) (pediatric): Secondary | ICD-10-CM

## 2019-08-21 DIAGNOSIS — R0683 Snoring: Secondary | ICD-10-CM

## 2019-08-21 DIAGNOSIS — G4720 Circadian rhythm sleep disorder, unspecified type: Secondary | ICD-10-CM

## 2019-08-21 DIAGNOSIS — E669 Obesity, unspecified: Secondary | ICD-10-CM

## 2019-08-29 ENCOUNTER — Telehealth: Payer: Self-pay

## 2019-08-29 NOTE — Procedures (Signed)
PATIENT'S NAME:  Donald Estrada, Donald Estrada DOB:      March 25, 1981      MR#:    [id number]     DATE OF RECORDING: 08/21/2019 REFERRING M.D.:  Jenna Luo, MD Study Performed:   Baseline Polysomnogram HISTORY: 38 year old man with a history of hypogonadism, hypertriglyceridemia, elevated LFTs, gout, allergies, prediabetes and borderline obesity, who reports snoring and excessive daytime somnolence. His Epworth sleepiness score is 16 out of 24. The patient's weight 211 pounds with a height of 70 (inches), resulting in a BMI of 30.3 kg/m2. The patient's neck circumference measured 17 inches.  CURRENT MEDICATIONS: Zyloprim, Fenofibrate, Prilosec, Depostestosterone.   PROCEDURE:  This is a multichannel digital polysomnogram utilizing the Somnostar 11.2 system.  Electrodes and sensors were applied and monitored per AASM Specifications.   EEG, EOG, Chin and Limb EMG, were sampled at 200 Hz.  ECG, Snore and Nasal Pressure, Thermal Airflow, Respiratory Effort, CPAP Flow and Pressure, Oximetry was sampled at 50 Hz. Digital video and audio were recorded.      BASELINE STUDY  Lights Out was at 20:49 and Lights On at 04:58.  Total recording time (TRT) was 489.5 minutes, with a total sleep time (TST) of 452.5 minutes.   The patient's sleep latency to persistent sleep was delayed at 36 minutes.  REM latency was 190 minutes, which is delayed. The sleep efficiency was 92.4 %.     SLEEP ARCHITECTURE: WASO (Wake after sleep onset) was 22 minutes with minimal to mild sleep fragmentation noted. There were 4.5 minutes in Stage N1, 306 minutes Stage N2, 105 minutes Stage N3 and 37 minutes in Stage REM.  The percentage of Stage N1 was 1.%, Stage N2 was 67.6%, which is increased, Stage N3 was 23.2% and Stage R (REM sleep) was 8.2%, which is reduced. The arousals were noted as: 90 were spontaneous, 9 were associated with PLMs, 20 were associated with respiratory events.  RESPIRATORY ANALYSIS:  There were a total of 101 respiratory  events:  6 obstructive apneas, 2 central apneas and 0 mixed apneas with a total of 8 apneas and an apnea index (AI) of 1.1 /hour. There were 93 hypopneas with a hypopnea index of 12.3 /hour. The patient also had 0 respiratory event related arousals (RERAs).      The total APNEA/HYPOPNEA INDEX (AHI) was 13.4/hour and the total RESPIRATORY DISTURBANCE INDEX was 13.4 /hour.  13 events occurred in REM sleep and 162 events in NREM. The REM AHI was 21.1 /hour, versus a non-REM AHI of 12.7. The patient spent 222 minutes of total sleep time in the supine position and 231 minutes in non-supine.. The supine AHI was 18.7 versus a non-supine AHI of 8.3.  OXYGEN SATURATION & C02:  The Wake baseline 02 saturation was 91%, with the lowest being 87%. Time spent below 89% saturation equaled 3 minutes.  PERIODIC LIMB MOVEMENTS: The patient had a total of 33 Periodic Limb Movements.  The Periodic Limb Movement (PLM) index was 4.4 and the PLM Arousal index was 1.2/hour.  Audio and video analysis did not show any abnormal or unusual movements, behaviors, phonations or vocalizations. The patient took no bathroom breaks. Snoring was noted, ranging from mild to loud. The EKG was in keeping with normal sinus rhythm (NSR).  IMPRESSION:  1. Obstructive Sleep Apnea (OSA) 2. Dysfunctions associated with sleep stages or arousal from sleep  RECOMMENDATIONS:  1. This study demonstrates mild to moderate obstructive sleep apnea, with a total AHI of 13.4/hour, REM AHI of 21.2/hour, supine AHI of  18.7/hour and O2 nadir of 87%. Given the patient's medical history and sleep related complaints, treatment with positive airway pressure is recommended; this can be achieved in the form of autoPAP. Alternatively, a full-night CPAP titration study would allow optimization of therapy if needed. Other treatment options may include avoidance of supine sleep position along with weight loss, upper airway or jaw surgery in selected patients or the  use of an oral appliance in certain patients. ENT evaluation and/or consultation with a maxillofacial surgeon or dentist may be feasible in some instances.    2. Please note that untreated obstructive sleep apnea may carry additional perioperative morbidity. Patients with significant obstructive sleep apnea should receive perioperative PAP therapy and the surgeons and particularly the anesthesiologist should be informed of the diagnosis and the severity of the sleep disordered breathing. 3. This study shows some sleep fragmentation and abnormal sleep stage percentages; these are nonspecific findings and per se do not signify an intrinsic sleep disorder or a cause for the patient's sleep-related symptoms. Causes include (but are not limited to) the first night effect of the sleep study, circadian rhythm disturbances, medication effect or an underlying mood disorder or medical problem.  4. The patient should be cautioned not to drive, work at heights, or operate dangerous or heavy equipment when tired or sleepy. Review and reiteration of good sleep hygiene measures should be pursued with any patient. 5. The patient will be seen in follow-up by Dr. Frances Furbish at [facility] for discussion of the test results and further management strategies. The referring provider will be notified of the test results.  I certify that I have reviewed the entire raw data recording prior to the issuance of this report in accordance with the Standards of Accreditation of the American Academy of Sleep Medicine (AASM)   Huston Foley, MD, PhD Diplomat, American Board of Neurology and Sleep Medicine (Neurology and Sleep Medicine)

## 2019-08-29 NOTE — Telephone Encounter (Signed)
-----   Message from Star Age, MD sent at 08/29/2019  8:35 AM EST ----- Patient referred by Dr. Dennard Schaumann, seen by me on 07/04/19, diagnostic PSG on 08/21/19.    Please call and notify the patient that the recent sleep study showed mild to moderate obstructive sleep apnea and worth treating with positive airway pressure, to see if he feels better after treatment (ESS was 16). To that end I recommend treatment for this in the form of autoPAP, which means, that we don't have to bring him back for a second sleep study with CPAP, but will let him try an autoPAP machine at home, through a DME company (of his choice, or as per insurance requirement). The DME representative will educate him on how to use the machine, how to put the mask on, etc. I have placed an order in the chart. Please send referral, talk to patient, send report to referring MD. We will need a FU in sleep clinic for 10 weeks post-PAP set up, please arrange that with me or one of our NPs. Thanks,   Star Age, MD, PhD [facility])

## 2019-08-29 NOTE — Telephone Encounter (Signed)
I called pt to discuss his sleep study results. No answer, left a message asking him to call me back. 

## 2019-08-29 NOTE — Progress Notes (Signed)
Patient referred by Dr. Dennard Schaumann, seen by me on 07/04/19, diagnostic PSG on 08/21/19.    Please call and notify the patient that the recent sleep study showed mild to moderate obstructive sleep apnea and worth treating with positive airway pressure, to see if he feels better after treatment (ESS was 16). To that end I recommend treatment for this in the form of autoPAP, which means, that we don't have to bring him back for a second sleep study with CPAP, but will let him try an autoPAP machine at home, through a DME company (of his choice, or as per insurance requirement). The DME representative will educate him on how to use the machine, how to put the mask on, etc. I have placed an order in the chart. Please send referral, talk to patient, send report to referring MD. We will need a FU in sleep clinic for 10 weeks post-PAP set up, please arrange that with me or one of our NPs. Thanks,   Star Age, MD, PhD [facility])

## 2019-08-29 NOTE — Addendum Note (Signed)
Addended by: Star Age on: 08/29/2019 08:35 AM   Modules accepted: Orders

## 2019-08-30 NOTE — Telephone Encounter (Signed)
I called pt. I advised pt that Dr. Rexene Alberts reviewed their sleep study results and found that pt has mild to moderate osa. Dr. Rexene Alberts recommends that pt start an auto pap at home. I reviewed PAP compliance expectations with the pt. Pt is agreeable to starting an auto-PAP. I advised pt that an order will be sent to a DME, [facility], and [facility] will call the pt within about one week after they file with the pt's insurance. [facility] will show the pt how to use the machine, fit for masks, and troubleshoot the auto-PAP if needed. A follow up appt was made for insurance purposes with Amy, NP on 11/08/19 at 8:30am. Pt verbalized understanding to arrive 15 minutes early and bring their auto-PAP. A letter with all of this information in it will be mailed to the pt as a reminder. I verified with the pt that the address we have on file is correct. Pt verbalized understanding of results. Pt had no questions at this time but was encouraged to call back if questions arise. I have sent the order to [facility] and have received confirmation that they have received the order.

## 2019-08-30 NOTE — Telephone Encounter (Signed)
Pt has returned the call to RN Kristen, please call °

## 2019-09-25 DIAGNOSIS — G4733 Obstructive sleep apnea (adult) (pediatric): Secondary | ICD-10-CM | POA: Diagnosis not present

## 2019-10-26 DIAGNOSIS — G4733 Obstructive sleep apnea (adult) (pediatric): Secondary | ICD-10-CM | POA: Diagnosis not present

## 2019-10-31 ENCOUNTER — Other Ambulatory Visit: Payer: Self-pay | Admitting: Family Medicine

## 2019-10-31 NOTE — Telephone Encounter (Signed)
Ok to refill??  Last office visit 03/09/2019.  Last refill 07/03/2019.
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
## 2019-11-08 ENCOUNTER — Ambulatory Visit: Payer: BC Managed Care – PPO | Admitting: Family Medicine

## 2019-11-08 ENCOUNTER — Encounter: Payer: Self-pay | Admitting: Family Medicine

## 2019-11-08 ENCOUNTER — Other Ambulatory Visit: Payer: Self-pay

## 2019-11-08 VITALS — BP 124/78 | HR 81 | Temp 97.1°F | Ht 70.0 in | Wt 217.0 lb

## 2019-11-08 DIAGNOSIS — G4733 Obstructive sleep apnea (adult) (pediatric): Secondary | ICD-10-CM

## 2019-11-08 DIAGNOSIS — Z9989 Dependence on other enabling machines and devices: Secondary | ICD-10-CM | POA: Diagnosis not present

## 2019-11-08 NOTE — Patient Instructions (Addendum)
Please continue using your CPAP regularly. While your insurance requires that you use CPAP at least 4 hours each night on 70% of the nights, I recommend, that you not skip any nights and use it throughout the night if you can. Getting used to CPAP and staying with the treatment long term does take time and patience and discipline. Untreated obstructive sleep apnea when it is moderate to severe can have an adverse impact on cardiovascular health and raise her risk for heart disease, arrhythmias, hypertension, congestive heart failure, stroke and diabetes. Untreated obstructive sleep apnea causes sleep disruption, nonrestorative sleep, and sleep deprivation. This can have an impact on your day to day functioning and cause daytime sleepiness and impairment of cognitive function, memory loss, mood disturbance, and problems focussing. Using CPAP regularly can improve these symptoms.   Call DME if you continue to note leak  Follow up in 3 months    CPAP and BPAP Information CPAP and BPAP are methods of helping a person breathe with the use of air pressure. CPAP stands for "continuous positive airway pressure." BPAP stands for "bi-level positive airway pressure." In both methods, air is blown through your nose or mouth and into your air passages to help you breathe well. CPAP and BPAP use different amounts of pressure to blow air. With CPAP, the amount of pressure stays the same while you breathe in and out. With BPAP, the amount of pressure is increased when you breathe in (inhale) so that you can take larger breaths. Your health care provider will recommend whether CPAP or BPAP would be more helpful for you. Why are CPAP and BPAP treatments used? CPAP or BPAP can be helpful if you have:  Sleep apnea.  Chronic obstructive pulmonary disease (COPD).  Heart failure.  Medical conditions that weaken the muscles of the chest including muscular dystrophy, or neurological diseases such as amyotrophic lateral  sclerosis (ALS).  Other problems that cause breathing to be weak, abnormal, or difficult. CPAP is most commonly used for obstructive sleep apnea (OSA) to keep the airways from collapsing when the muscles relax during sleep. How is CPAP or BPAP administered? Both CPAP and BPAP are provided by a small machine with a flexible plastic tube that attaches to a plastic mask. You wear the mask. Air is blown through the mask into your nose or mouth. The amount of pressure that is used to blow the air can be adjusted on the machine. Your health care provider will determine the pressure setting that should be used based on your individual needs. When should CPAP or BPAP be used? In most cases, the mask only needs to be worn during sleep. Generally, the mask needs to be worn throughout the night and during any daytime naps. People with certain medical conditions may also need to wear the mask at other times when they are awake. Follow instructions from your health care provider about when to use the machine. What are some tips for using the mask?   Because the mask needs to be snug, some people feel trapped or closed-in (claustrophobic) when first using the mask. If you feel this way, you may need to get used to the mask. One way to do this is by holding the mask loosely over your nose or mouth and then gradually applying the mask more snugly. You can also gradually increase the amount of time that you use the mask.  Masks are available in various types and sizes. Some fit over your mouth and nose  while others fit over just your nose. If your mask does not fit well, talk with your health care provider about getting a different one.  If you are using a mask that fits over your nose and you tend to breathe through your mouth, a chin strap may be applied to help keep your mouth closed.  The CPAP and BPAP machines have alarms that may sound if the mask comes off or develops a leak.  If you have trouble with the  mask, it is very important that you talk with your health care provider about finding a way to make the mask easier to tolerate. Do not stop using the mask. Stopping the use of the mask could have a negative impact on your health. What are some tips for using the machine?  Place your CPAP or BPAP machine on a secure table or stand near an electrical outlet.  Know where the on/off switch is located on the machine.  Follow instructions from your health care provider about how to set the pressure on your machine and when you should use it.  Do not eat or drink while the CPAP or BPAP machine is on. Food or fluids could get pushed into your lungs by the pressure of the CPAP or BPAP.  Do not smoke. Tobacco smoke residue can damage the machine.  For home use, CPAP and BPAP machines can be rented or purchased through home health care companies. Many different brands of machines are available. Renting a machine before purchasing may help you find out which particular machine works well for you.  Keep the CPAP or BPAP machine and attachments clean. Ask your health care provider for specific instructions. Get help right away if:  You have redness or open areas around your nose or mouth where the mask fits.  You have trouble using the CPAP or BPAP machine.  You cannot tolerate wearing the CPAP or BPAP mask.  You have pain, discomfort, and bloating in your abdomen. Summary  CPAP and BPAP are methods of helping a person breathe with the use of air pressure.  Both CPAP and BPAP are provided by a small machine with a flexible plastic tube that attaches to a plastic mask.  If you have trouble with the mask, it is very important that you talk with your health care provider about finding a way to make the mask easier to tolerate. This information is not intended to replace advice given to you by your health care provider. Make sure you discuss any questions you have with your health care  provider. Document Revised: 12/28/2018 Document Reviewed: 07/27/2016 Elsevier Patient Education  [place].

## 2019-11-12 ENCOUNTER — Other Ambulatory Visit: Payer: Self-pay

## 2019-11-12 ENCOUNTER — Emergency Department
Admission: EM | Admit: 2019-11-12 | Discharge: 2019-11-12 | Disposition: A | Discharge status: Home / Self Care | Payer: BLUE CROSS/BLUE SHIELD | Attending: Emergency Medicine | Admitting: Emergency Medicine

## 2019-11-12 ENCOUNTER — Encounter: Payer: Self-pay | Admitting: Emergency Medicine

## 2019-11-12 ENCOUNTER — Emergency Department: Payer: BLUE CROSS/BLUE SHIELD

## 2019-11-12 DIAGNOSIS — Z5321 Procedure and treatment not carried out due to patient leaving prior to being seen by health care provider: Secondary | ICD-10-CM | POA: Diagnosis not present

## 2019-11-12 DIAGNOSIS — R109 Unspecified abdominal pain: Secondary | ICD-10-CM | POA: Insufficient documentation

## 2019-11-12 MED ORDER — OXYCODONE-ACETAMINOPHEN 5-325 MG PO TABS
1.0000 | ORAL_TABLET | Freq: Once | ORAL | Status: DC
  Filled 2019-11-12: qty 1

## 2019-11-12 NOTE — ED Notes (Signed)
Pt reports ready to leave as stone has passed   orders will be canceled and narcotic returned

## 2019-11-12 NOTE — ED Notes (Signed)
Pt to desk states he passed his stone while giving a urine sample.  States he does not want to be seen at this time.  Pt leaving at this time.

## 2019-11-12 NOTE — ED Triage Notes (Signed)
Pt to triage via POV with c/o right flank pain 7/10 for the last 2 weeks but worse this am.  Pt reports hx of kidney and reports this feels like the same except pt also c/o of a "pulling up" feeling in right testicle.  Pt reports wife drove him to ED

## 2019-11-13 ENCOUNTER — Observation Stay: Payer: BC Managed Care – PPO

## 2019-11-13 ENCOUNTER — Other Ambulatory Visit: Payer: Self-pay

## 2019-11-13 ENCOUNTER — Emergency Department
Admission: EM | Admit: 2019-11-13 | Discharge: 2019-11-13 | Disposition: A | Discharge status: Home / Self Care | Payer: BC Managed Care – PPO | Source: Home / Self Care | Attending: Emergency Medicine | Admitting: Emergency Medicine

## 2019-11-13 ENCOUNTER — Encounter: Payer: Self-pay | Admitting: Internal Medicine

## 2019-11-13 ENCOUNTER — Emergency Department: Payer: BC Managed Care – PPO

## 2019-11-13 ENCOUNTER — Encounter: Payer: Self-pay | Admitting: Emergency Medicine

## 2019-11-13 ENCOUNTER — Telehealth: Payer: Self-pay | Admitting: Family Medicine

## 2019-11-13 ENCOUNTER — Observation Stay
Admission: AD | Admit: 2019-11-13 | Discharge: 2019-11-14 | Disposition: A | Discharge status: Home / Self Care | Payer: BC Managed Care – PPO | Source: Ambulatory Visit | Attending: Internal Medicine | Admitting: Internal Medicine

## 2019-11-13 DIAGNOSIS — M109 Gout, unspecified: Secondary | ICD-10-CM | POA: Diagnosis not present

## 2019-11-13 DIAGNOSIS — E785 Hyperlipidemia, unspecified: Secondary | ICD-10-CM | POA: Diagnosis not present

## 2019-11-13 DIAGNOSIS — N200 Calculus of kidney: Secondary | ICD-10-CM | POA: Diagnosis not present

## 2019-11-13 DIAGNOSIS — E781 Pure hyperglyceridemia: Secondary | ICD-10-CM | POA: Diagnosis not present

## 2019-11-13 DIAGNOSIS — K219 Gastro-esophageal reflux disease without esophagitis: Secondary | ICD-10-CM | POA: Insufficient documentation

## 2019-11-13 DIAGNOSIS — F17220 Nicotine dependence, chewing tobacco, uncomplicated: Secondary | ICD-10-CM | POA: Insufficient documentation

## 2019-11-13 DIAGNOSIS — N179 Acute kidney failure, unspecified: Secondary | ICD-10-CM | POA: Diagnosis not present

## 2019-11-13 DIAGNOSIS — Z20822 Contact with and (suspected) exposure to covid-19: Secondary | ICD-10-CM | POA: Diagnosis not present

## 2019-11-13 DIAGNOSIS — N1330 Unspecified hydronephrosis: Secondary | ICD-10-CM | POA: Insufficient documentation

## 2019-11-13 DIAGNOSIS — Z87442 Personal history of urinary calculi: Secondary | ICD-10-CM | POA: Diagnosis not present

## 2019-11-13 DIAGNOSIS — K760 Fatty (change of) liver, not elsewhere classified: Secondary | ICD-10-CM | POA: Diagnosis not present

## 2019-11-13 DIAGNOSIS — K5730 Diverticulosis of large intestine without perforation or abscess without bleeding: Secondary | ICD-10-CM | POA: Diagnosis not present

## 2019-11-13 DIAGNOSIS — Z79899 Other long term (current) drug therapy: Secondary | ICD-10-CM | POA: Insufficient documentation

## 2019-11-13 DIAGNOSIS — N23 Unspecified renal colic: Secondary | ICD-10-CM

## 2019-11-13 DIAGNOSIS — Z794 Long term (current) use of insulin: Secondary | ICD-10-CM | POA: Insufficient documentation

## 2019-11-13 DIAGNOSIS — R7303 Prediabetes: Secondary | ICD-10-CM | POA: Diagnosis not present

## 2019-11-13 DIAGNOSIS — N132 Hydronephrosis with renal and ureteral calculous obstruction: Secondary | ICD-10-CM | POA: Diagnosis not present

## 2019-11-13 DIAGNOSIS — N202 Calculus of kidney with calculus of ureter: Secondary | ICD-10-CM | POA: Insufficient documentation

## 2019-11-13 LAB — COMPREHENSIVE METABOLIC PANEL
ALT: 103 U/L — ABNORMAL HIGH (ref 0–44)
AST: 61 U/L — ABNORMAL HIGH (ref 15–41)
Albumin: 4.4 g/dL (ref 3.5–5.0)
Alkaline Phosphatase: 33 U/L — ABNORMAL LOW (ref 38–126)
Anion gap: 8 (ref 5–15)
BUN: 15 mg/dL (ref 6–20)
CO2: 26 mmol/L (ref 22–32)
Calcium: 9.2 mg/dL (ref 8.9–10.3)
Chloride: 105 mmol/L (ref 98–111)
Creatinine, Ser: 1.36 mg/dL — ABNORMAL HIGH (ref 0.61–1.24)
GFR calc Af Amer: >60 mL/min (ref >60)
GFR calc non Af Amer: >60 mL/min (ref >60)
Glucose, Bld: 122 mg/dL — ABNORMAL HIGH (ref 70–99)
Potassium: 3.7 mmol/L (ref 3.5–5.1)
Sodium: 139 mmol/L (ref 135–145)
Total Bilirubin: 0.8 mg/dL (ref 0.3–1.2)
Total Protein: 6.6 g/dL (ref 6.5–8.1)

## 2019-11-13 LAB — URINALYSIS, COMPLETE (UACMP) WITH MICROSCOPIC
Bacteria, UA: NONE SEEN
Bilirubin Urine: NEGATIVE
Glucose, UA: NEGATIVE mg/dL
Ketones, ur: NEGATIVE mg/dL
Leukocytes,Ua: NEGATIVE
Nitrite: NEGATIVE
Protein, ur: 100 mg/dL — AB
RBC / HPF: >50 RBC/hpf — ABNORMAL HIGH (ref 0–5)
Specific Gravity, Urine: 1.018 (ref 1.005–1.030)
Squamous Epithelial / HPF: NONE SEEN (ref 0–5)
pH: 6 (ref 5.0–8.0)

## 2019-11-13 LAB — CBC WITH DIFFERENTIAL/PLATELET
Abs Immature Granulocytes: 0.05 10*3/uL (ref 0.00–0.07)
Basophils Absolute: 0.1 10*3/uL (ref 0.0–0.1)
Basophils Relative: 1 %
Eosinophils Absolute: 0.1 10*3/uL (ref 0.0–0.5)
Eosinophils Relative: 1 %
HCT: 38.7 % — ABNORMAL LOW (ref 39.0–52.0)
Hemoglobin: 12.9 g/dL — ABNORMAL LOW (ref 13.0–17.0)
Immature Granulocytes: 1 %
Lymphocytes Relative: 16 %
Lymphs Abs: 1.6 10*3/uL (ref 0.7–4.0)
MCH: 31.6 pg (ref 26.0–34.0)
MCHC: 33.3 g/dL (ref 30.0–36.0)
MCV: 94.9 fL (ref 80.0–100.0)
Monocytes Absolute: 0.9 10*3/uL (ref 0.1–1.0)
Monocytes Relative: 9 %
Neutro Abs: 7.5 10*3/uL (ref 1.7–7.7)
Neutrophils Relative %: 72 %
Platelets: 255 10*3/uL (ref 150–400)
RBC: 4.08 MIL/uL — ABNORMAL LOW (ref 4.22–5.81)
RDW: 12.4 % (ref 11.5–15.5)
WBC: 10.2 10*3/uL (ref 4.0–10.5)
nRBC: 0 % (ref 0.0–0.2)

## 2019-11-13 LAB — URINALYSIS, ROUTINE W REFLEX MICROSCOPIC
Bilirubin Urine: NEGATIVE
Glucose, UA: NEGATIVE mg/dL
Ketones, ur: NEGATIVE mg/dL
Leukocytes,Ua: NEGATIVE
Nitrite: NEGATIVE
Protein, ur: NEGATIVE mg/dL
RBC / HPF: >50 RBC/hpf — ABNORMAL HIGH (ref 0–5)
Specific Gravity, Urine: 1.009 (ref 1.005–1.030)
Squamous Epithelial / HPF: NONE SEEN (ref 0–5)
pH: 7 (ref 5.0–8.0)

## 2019-11-13 LAB — URIC ACID: Uric Acid, Serum: 4.9 mg/dL (ref 3.7–8.6)

## 2019-11-13 LAB — RESPIRATORY PANEL BY RT PCR (FLU A&B, COVID)
Influenza A by PCR: NEGATIVE
Influenza B by PCR: NEGATIVE
SARS Coronavirus 2 by RT PCR: NEGATIVE

## 2019-11-13 MED ORDER — PANTOPRAZOLE SODIUM 40 MG PO TBEC: 40.0000 mg | DELAYED_RELEASE_TABLET | Freq: Every day | ORAL | Status: DC

## 2019-11-13 MED ORDER — KETOROLAC TROMETHAMINE 10 MG PO TABS: 10.0000 mg | ORAL_TABLET | Freq: Four times a day (QID) | ORAL | 0 refills | Status: DC | PRN

## 2019-11-13 MED ORDER — SODIUM CHLORIDE 0.9 % IV BOLUS
1000.0000 mL | Freq: Once | INTRAVENOUS | Status: AC
  Administered 2019-11-13: 11:00:00 1000 mL via INTRAVENOUS

## 2019-11-13 MED ORDER — KETOROLAC TROMETHAMINE 30 MG/ML IJ SOLN
30.0000 mg | Freq: Once | INTRAMUSCULAR | Status: AC
  Administered 2019-11-13: 11:00:00 30 mg via INTRAVENOUS
  Filled 2019-11-13: qty 1

## 2019-11-13 MED ORDER — SODIUM CHLORIDE 0.9% FLUSH: 3.0000 mL | INTRAVENOUS | Status: DC | PRN

## 2019-11-13 MED ORDER — SODIUM CHLORIDE 0.9 % IV SOLN: INTRAVENOUS | Status: DC

## 2019-11-13 MED ORDER — FENOFIBRATE 160 MG PO TABS
160.0000 mg | ORAL_TABLET | Freq: Every day | ORAL | Status: DC
  Filled 2019-11-13 (×2): qty 1

## 2019-11-13 MED ORDER — SODIUM CHLORIDE 0.9% FLUSH
3.0000 mL | Freq: Two times a day (BID) | INTRAVENOUS | Status: DC
  Administered 2019-11-14 (×2): 3 mL via INTRAVENOUS

## 2019-11-13 MED ORDER — ENOXAPARIN SODIUM 40 MG/0.4ML ~~LOC~~ SOLN
40.0000 mg | SUBCUTANEOUS | Status: DC
  Administered 2019-11-13: 40 mg via SUBCUTANEOUS
  Filled 2019-11-13: qty 0.4

## 2019-11-13 MED ORDER — HYDROMORPHONE HCL 1 MG/ML IJ SOLN
1.0000 mg | INTRAMUSCULAR | Status: DC | PRN
  Administered 2019-11-13 – 2019-11-14 (×4): 1 mg via INTRAVENOUS
  Filled 2019-11-13 (×4): qty 1

## 2019-11-13 MED ORDER — SODIUM CHLORIDE 0.9 % IV SOLN: 250.0000 mL | INTRAVENOUS | Status: DC | PRN

## 2019-11-13 MED ORDER — TAMSULOSIN HCL 0.4 MG PO CAPS: 0.4000 mg | ORAL_CAPSULE | Freq: Once | ORAL | Status: DC

## 2019-11-13 MED ORDER — HYDROMORPHONE HCL 1 MG/ML IJ SOLN
1.0000 mg | Freq: Once | INTRAMUSCULAR | Status: AC
  Administered 2019-11-13: 1 mg via INTRAVENOUS
  Filled 2019-11-13: qty 1

## 2019-11-13 MED ORDER — KETOROLAC TROMETHAMINE 30 MG/ML IJ SOLN
30.0000 mg | Freq: Four times a day (QID) | INTRAMUSCULAR | Status: DC | PRN
  Administered 2019-11-13 – 2019-11-14 (×2): 30 mg via INTRAVENOUS
  Filled 2019-11-13 (×2): qty 1

## 2019-11-13 MED ORDER — ALLOPURINOL 100 MG PO TABS: 300.0000 mg | ORAL_TABLET | Freq: Every day | ORAL | Status: DC

## 2019-11-13 MED ORDER — ONDANSETRON HCL 4 MG/2ML IJ SOLN
4.0000 mg | Freq: Four times a day (QID) | INTRAMUSCULAR | Status: DC | PRN
  Administered 2019-11-13 – 2019-11-14 (×3): 4 mg via INTRAVENOUS
  Filled 2019-11-13 (×3): qty 2

## 2019-11-13 MED ORDER — TAMSULOSIN HCL 0.4 MG PO CAPS: 0.4000 mg | ORAL_CAPSULE | Freq: Every day | ORAL | Status: DC

## 2019-11-13 MED ORDER — OXYCODONE-ACETAMINOPHEN 7.5-325 MG PO TABS: 1.0000 | ORAL_TABLET | Freq: Four times a day (QID) | ORAL | 0 refills | Status: DC | PRN

## 2019-11-13 MED ORDER — HYDROMORPHONE HCL 1 MG/ML IJ SOLN
1.0000 mg | Freq: Once | INTRAMUSCULAR | Status: AC
  Administered 2019-11-13: 1 mg via INTRAMUSCULAR
  Filled 2019-11-13: qty 1

## 2019-11-13 MED ORDER — TAMSULOSIN HCL 0.4 MG PO CAPS: 0.4000 mg | ORAL_CAPSULE | Freq: Every day | ORAL | 0 refills | Status: DC

## 2019-11-13 NOTE — ED Notes (Signed)
First nurse called and informed this RN that patient's wife stated she gave him Dilaudid this morning. First nurse stated EDP [facility] approved 4 of Morphine and 4 of Zofran. Pain meds have already been given so will hold on this order.

## 2019-11-13 NOTE — ED Triage Notes (Signed)
Pt states right flank and groin pain that began yesterday, was seen here for the same yesterday, passed kidney stone while giving urine specimen yesterday, in obvious pain in triage.

## 2019-11-13 NOTE — ED Notes (Signed)
See triage note  Presents with right flank pain which is moving into right testes  Pt is pacing

## 2019-11-13 NOTE — Telephone Encounter (Signed)
Patient is not currently prescribed Dilaudid he is taking from an old prescription that he had from 5 years ago when he had a kidney stone. He was prescribed percocet 7.5-325 and has taken a dose. Patient informed me that they already have an appointment with Urology tomorrow but feels that they need something else in the meantime. Per ER note patient was also given Dilaudid in ER with minimal relief. Please advise?

## 2019-11-13 NOTE — ED Notes (Signed)
Back from ct scan  States back pain is improving but conts to have pain in right testes

## 2019-11-13 NOTE — ED Provider Notes (Signed)
[facility] Emergency Department Provider Note   ____________________________________________   First MD Initiated Contact with Patient 11/13/19 1028     (approximate)  I have reviewed the triage vital signs and the nursing notes.   HISTORY  Chief Complaint Flank Pain    HPI Donald Estrada is a 39 y.o. male patient presents with right flank pain that began yesterday.  Reported to the ED yesterday but state while waiting to be seen he voided and the stone passed.  Patient afebrile relief so he did not wait to be evaluated.  Patient this morning he working again with right flank pain which is more pronounced than the one yesterday.  Patient denies urinary complaint.  Patient rates pain as 8/10.  Describes the pain as "sharp".  Patient states feels like the pain is radiating to his scrotum.  No palliative measure for complaint.         Past Medical History:  Diagnosis Date  . Allergy    seasonal  . Elevated LFTs   . Gout   . Hypertriglyceridemia   . Prediabetes     Patient Active Problem List   Diagnosis Date Noted  . Hypogonadism in male 05/06/2018  . Fatty liver 12/27/2015  . Hypertriglyceridemia 12/27/2015    Past Surgical History:  Procedure Laterality Date  . INGUINAL HERNIA REPAIR     right    Prior to Admission medications   Medication Sig Start Date End Date Taking? Authorizing Provider  allopurinol (ZYLOPRIM) 300 MG tablet TAKE 1 TABLET BY MOUTH EVERY DAY 06/05/19   Donita Brooks, MD  Colchicine 0.6 MG CAPS Take 0.6 mg by mouth daily. Patient taking differently: Take 0.6 mg by mouth daily as needed (gout).  01/03/16   Donita Brooks, MD  fenofibrate 160 MG tablet TAKE 1 TABLET BY MOUTH EVERY DAY 06/12/19   Donita Brooks, MD  ketorolac (TORADOL) 10 MG tablet Take 1 tablet (10 mg total) by mouth every 6 (six) hours as needed. 11/13/19   Joni Reining, PA-C  omeprazole (PRILOSEC) 20 MG capsule Take 20 mg by mouth daily as  needed (heartburn and indigestion.).     [provider]  oxyCODONE-acetaminophen (PERCOCET) 7.5-325 MG tablet Take 1 tablet by mouth every 6 (six) hours as needed. 11/13/19   Joni Reining, PA-C  SYRINGE-NEEDLE, DISP, 3 ML (B-D 3CC LUER-LOK SYR 23GX1") 23G X 1" 3 ML MISC USE TO INJECT TESTOSTERONE IM 02/15/19   Donita Brooks, MD  testosterone cypionate (DEPOTESTOSTERONE CYPIONATE) 200 MG/ML injection INJECT 1 ML INTO THE MUSCLE EVERY 14 DAYS 10/31/19   Donita Brooks, MD    Allergies Patient has no known allergies.  Family History  Adopted: Yes  Problem Relation Age of Onset  . Heart disease Maternal Grandmother     Social History Social History   Tobacco Use  . Smoking status: Never Smoker  . Smokeless tobacco: Current User    Types: Chew, Snuff  Substance Use Topics  . Alcohol use: Yes    Comment: rarely drinks  . Drug use: No    Review of Systems Constitutional: No fever/chills Eyes: No visual changes. ENT: No sore throat. Cardiovascular: Denies chest pain. Respiratory: Denies shortness of breath. Gastrointestinal: No abdominal pain.  No nausea, no vomiting.  No diarrhea.  No constipation. Genitourinary: Negative for dysuria. Musculoskeletal: Right flank pain.   Skin: Negative for rash. Neurological: Negative for headaches, focal weakness or numbness. Endocrine:  Hyperlipidemia, gout, and prediabetic. ____________________________________________  PHYSICAL EXAM:  VITAL SIGNS: ED Triage Vitals [11/13/19 1009]  Enc Vitals Group     BP (!) 147/100     Pulse Rate 79     Resp (!) 22     Temp 98.3 F (36.8 C)     Temp Source Oral     SpO2 98 %     Weight 214 lb (97.1 kg)     Height 5\' 10"  (1.778 m)     Head Circumference      Peak Flow      Pain Score 8     Pain Loc      Pain Edu?      Excl. in [place]?    Constitutional: Alert and oriented. Well appearing and in no acute distress. Cardiovascular: Normal rate, regular rhythm. Grossly normal  heart sounds.  Good peripheral circulation. Respiratory: Normal respiratory effort.  No retractions. Lungs CTAB. Gastrointestinal: Soft and nontender. No distention. No abdominal bruits.  Right CVA tenderness. Genitourinary: Deferred Musculoskeletal: No lower extremity tenderness nor edema.  No joint effusions. Neurologic:  Normal speech and language. No gross focal neurologic deficits are appreciated. No gait instability. Skin:  Skin is warm, dry and intact. No rash noted. Psychiatric: Mood and affect are normal. Speech and behavior are normal.  ____________________________________________   LABS (all labs ordered are listed, but only abnormal results are displayed)  Labs Reviewed  URINALYSIS, COMPLETE (UACMP) WITH MICROSCOPIC - Abnormal; Notable for the following components:      Result Value   Color, Urine YELLOW (*)    APPearance HAZY (*)    Hgb urine dipstick LARGE (*)    Protein, ur 100 (*)    RBC / HPF >50 (*)    All other components within normal limits   ____________________________________________  EKG   ____________________________________________  RADIOLOGY  ED MD interpretation:    Official radiology report(s): CT Renal Stone Study  Result Date: 11/13/2019 CLINICAL DATA:  Right flank region pain EXAM: CT ABDOMEN AND PELVIS WITHOUT CONTRAST TECHNIQUE: Multidetector CT imaging of the abdomen and pelvis was performed following the standard protocol without oral or IV contrast. COMPARISON:  August 18, 2015 FINDINGS: Lower chest: There is slight posterior left base atelectasis. Lung bases otherwise are clear. Hepatobiliary: There is hepatic steatosis. No focal liver lesions are appreciable on this noncontrast enhanced study. Gallbladder wall is not appreciably thickened. There is no biliary duct dilatation. Pancreas: There is no pancreatic mass or inflammatory focus. Spleen: No splenic lesions are evident. Adrenals/Urinary Tract: Adrenals bilaterally appear normal.  Right kidney is subtly edematous. There is mild hydronephrosis on the right. There is no hydronephrosis on the left. There is no appreciable renal mass on either side. There is a calculus in the lower pole of the right kidney measuring 7 x 5 mm. There is no intrarenal calculus on the left. There is a calculus in the mid right ureter at the S1 level measuring 5 x 5 mm with mild focal ureteral edema in this area. No other ureteral calculi are evident. Urinary bladder is midline with wall thickness within normal limits. Stomach/Bowel: There are sigmoid diverticula without diverticulitis. There is no appreciable bowel wall or mesenteric thickening. Terminal ileum appears normal. No evident bowel obstruction. No free air or portal venous air. Vascular/Lymphatic: There is no abdominal aortic aneurysm. No vascular lesions are evident on this noncontrast enhanced study. There is no evident adenopathy in the abdomen or pelvis. A partially calcified subcentimeter lymph node in the left mid pelvis is considered  nonspecific. Reproductive: Prostate and seminal vesicles are normal in size and contour. No evident pelvic mass. Other: Appendix appears normal. There is no evident abscess or ascites in the abdomen or pelvis. Postoperative change noted in the right inguinal region. No evident bowel containing hernia. Musculoskeletal: No blastic or lytic bone lesions. No intramuscular lesion evident. IMPRESSION: 1. There is a 5 x 5 mm calculus at the S1 level in the right ureter with mild hydronephrosis on the right. 2.  Nonobstructing 7 x 5 mm calculus lower pole right kidney. 3. No bowel obstruction. There are sigmoid diverticula without diverticulitis. No abscess in the abdomen or pelvis. Appendix appears normal. 4.  Hepatic steatosis. Electronically Signed   By: Bretta Bang III M.D.   On: 11/13/2019 11:28    ____________________________________________   PROCEDURES  Procedure(s) performed (including Critical Care):   Procedures   ____________________________________________   INITIAL IMPRESSION / ASSESSMENT AND PLAN / ED COURSE  As part of my medical decision making, I reviewed the following data within the electronic MEDICAL RECORD NUMBER      Patient presents with right flank pain and hematuria.  Discussed CT findings with patient showing a 5 x5 mm renal calculus at the distal right ureter.  Patient also had a 7 x 5 mm calculus in the lower pole of the right kidney.  Patient is moderate relief with IV fluids, Dilaudid, and Toradol.  Patient discharge instructions were given.  Patient given prescription for Percocets and continue Toradol and oral hydration.  Patient given strainer to monitor passage of stone.  Patient advised follow-up with urology secondary to the nonobstructive stone  lower right kidney pole.    Spenser Harren was evaluated in Emergency Department on 11/13/2019 for the symptoms described in the history of present illness. He was evaluated in the context of the global COVID-19 pandemic, which necessitated consideration that the patient might be at risk for infection with the SARS-CoV-2 virus that causes COVID-19. Institutional protocols and algorithms that pertain to the evaluation of patients at risk for COVID-19 are in a state of rapid change based on information released by regulatory bodies including the CDC and federal and state organizations. These policies and algorithms were followed during the patient's care in the ED.       ____________________________________________   FINAL CLINICAL IMPRESSION(S) / ED DIAGNOSES  Final diagnoses:  Kidney stone     ED Discharge Orders         Ordered    oxyCODONE-acetaminophen (PERCOCET) 7.5-325 MG tablet  Every 6 hours PRN     11/13/19 1211    ketorolac (TORADOL) 10 MG tablet  Every 6 hours PRN     11/13/19 1211           Note:  This document was prepared using Dragon voice recognition software and may include unintentional  dictation errors.    Joni Reining, PA-C 11/13/19 1220    Sharyn Creamer, MD 11/15/19 [phone]

## 2019-11-13 NOTE — Telephone Encounter (Signed)
Spoke with patient's wife and informed her that Dr.Pickard recommends that he return back to ER as he is already taking strong medications at home as he may require something stronger. Patient's wife verbalized understanding ands that a provider they know is going to admit him.

## 2019-11-13 NOTE — Discharge Instructions (Signed)
Follow discharge care instructions and take medications as directed.  Strain urine for passage of stone.  Be advised you have a left kidney which needs evaluation by urologist.

## 2019-11-14 ENCOUNTER — Ambulatory Visit: Payer: Self-pay | Admitting: Urology

## 2019-11-14 ENCOUNTER — Encounter
Admission: AD | Disposition: A | Discharge status: Home / Self Care | Payer: Self-pay | Source: Ambulatory Visit | Attending: Internal Medicine

## 2019-11-14 ENCOUNTER — Observation Stay: Payer: BC Managed Care – PPO | Admitting: Certified Registered Nurse Anesthetist

## 2019-11-14 ENCOUNTER — Other Ambulatory Visit: Payer: Self-pay | Admitting: Urology

## 2019-11-14 ENCOUNTER — Observation Stay: Payer: BC Managed Care – PPO

## 2019-11-14 ENCOUNTER — Encounter: Payer: Self-pay | Admitting: Emergency Medicine

## 2019-11-14 DIAGNOSIS — N23 Unspecified renal colic: Secondary | ICD-10-CM

## 2019-11-14 DIAGNOSIS — M109 Gout, unspecified: Secondary | ICD-10-CM | POA: Diagnosis not present

## 2019-11-14 DIAGNOSIS — N179 Acute kidney failure, unspecified: Secondary | ICD-10-CM

## 2019-11-14 DIAGNOSIS — N132 Hydronephrosis with renal and ureteral calculous obstruction: Secondary | ICD-10-CM

## 2019-11-14 DIAGNOSIS — N202 Calculus of kidney with calculus of ureter: Secondary | ICD-10-CM | POA: Diagnosis not present

## 2019-11-14 HISTORY — PX: CYSTOSCOPY W/ RETROGRADES: SHX1426

## 2019-11-14 HISTORY — PX: CYSTOSCOPY/URETEROSCOPY/HOLMIUM LASER/STENT PLACEMENT: SHX6546

## 2019-11-14 LAB — RESPIRATORY PANEL BY PCR

## 2019-11-14 LAB — HIV ANTIBODY (ROUTINE TESTING W REFLEX): HIV Screen 4th Generation wRfx: NONREACTIVE

## 2019-11-14 SURGERY — CYSTOSCOPY/URETEROSCOPY/HOLMIUM LASER/STENT PLACEMENT
Anesthesia: General | Site: Ureter | Laterality: Right

## 2019-11-14 MED ORDER — OXYBUTYNIN CHLORIDE 5 MG PO TABS
ORAL_TABLET | ORAL | Status: AC
  Administered 2019-11-14: 5 mg via ORAL
  Filled 2019-11-14: qty 1

## 2019-11-14 MED ORDER — ONDANSETRON HCL 4 MG/2ML IJ SOLN
INTRAMUSCULAR | Status: DC | PRN
  Administered 2019-11-14: 4 mg via INTRAVENOUS

## 2019-11-14 MED ORDER — HYDROMORPHONE HCL 1 MG/ML IJ SOLN
2.0000 mg | INTRAMUSCULAR | Status: DC | PRN
  Administered 2019-11-14: 2 mg via INTRAVENOUS
  Filled 2019-11-14: qty 2

## 2019-11-14 MED ORDER — LIDOCAINE HCL (PF) 2 % IJ SOLN
INTRAMUSCULAR | Status: AC
  Filled 2019-11-14: qty 5

## 2019-11-14 MED ORDER — EPHEDRINE SULFATE 50 MG/ML IJ SOLN
INTRAMUSCULAR | Status: AC
  Filled 2019-11-14: qty 1

## 2019-11-14 MED ORDER — SUGAMMADEX SODIUM 200 MG/2ML IV SOLN
INTRAVENOUS | Status: AC
  Filled 2019-11-14: qty 2

## 2019-11-14 MED ORDER — CEFAZOLIN SODIUM 1 G IJ SOLR
INTRAMUSCULAR | Status: AC
  Filled 2019-11-14: qty 20

## 2019-11-14 MED ORDER — DEXMEDETOMIDINE HCL 200 MCG/2ML IV SOLN
INTRAVENOUS | Status: DC | PRN
  Administered 2019-11-14: 8 ug via INTRAVENOUS
  Administered 2019-11-14: 4 ug via INTRAVENOUS

## 2019-11-14 MED ORDER — DEXAMETHASONE SODIUM PHOSPHATE 10 MG/ML IJ SOLN
INTRAMUSCULAR | Status: DC | PRN
  Administered 2019-11-14: 5 mg via INTRAVENOUS

## 2019-11-14 MED ORDER — ROCURONIUM BROMIDE 100 MG/10ML IV SOLN
INTRAVENOUS | Status: DC | PRN
  Administered 2019-11-14: 30 mg via INTRAVENOUS
  Administered 2019-11-14 (×2): 10 mg via INTRAVENOUS

## 2019-11-14 MED ORDER — ONDANSETRON HCL 4 MG/2ML IJ SOLN
INTRAMUSCULAR | Status: AC
  Filled 2019-11-14: qty 2

## 2019-11-14 MED ORDER — PROPOFOL 10 MG/ML IV BOLUS
INTRAVENOUS | Status: AC
  Filled 2019-11-14: qty 20

## 2019-11-14 MED ORDER — FENTANYL CITRATE (PF) 100 MCG/2ML IJ SOLN
INTRAMUSCULAR | Status: AC
  Filled 2019-11-14: qty 2

## 2019-11-14 MED ORDER — OXYBUTYNIN CHLORIDE 5 MG PO TABS: ORAL_TABLET | ORAL | 0 refills | Status: DC

## 2019-11-14 MED ORDER — MIDAZOLAM HCL 2 MG/2ML IJ SOLN
INTRAMUSCULAR | Status: AC
  Filled 2019-11-14: qty 2

## 2019-11-14 MED ORDER — PHENYLEPHRINE HCL (PRESSORS) 10 MG/ML IV SOLN
INTRAVENOUS | Status: DC | PRN
  Administered 2019-11-14 (×4): 100 ug via INTRAVENOUS

## 2019-11-14 MED ORDER — MIDAZOLAM HCL 2 MG/2ML IJ SOLN
INTRAMUSCULAR | Status: DC | PRN
  Administered 2019-11-14: 2 mg via INTRAVENOUS

## 2019-11-14 MED ORDER — FENTANYL CITRATE (PF) 100 MCG/2ML IJ SOLN
INTRAMUSCULAR | Status: DC | PRN
  Administered 2019-11-14: 100 ug via INTRAVENOUS

## 2019-11-14 MED ORDER — SUGAMMADEX SODIUM 200 MG/2ML IV SOLN
INTRAVENOUS | Status: DC | PRN
  Administered 2019-11-14: 200 mg via INTRAVENOUS

## 2019-11-14 MED ORDER — LIDOCAINE HCL (CARDIAC) PF 100 MG/5ML IV SOSY
PREFILLED_SYRINGE | INTRAVENOUS | Status: DC | PRN
  Administered 2019-11-14: 100 mg via INTRAVENOUS

## 2019-11-14 MED ORDER — CEFAZOLIN SODIUM-DEXTROSE 1-4 GM/50ML-% IV SOLN
INTRAVENOUS | Status: DC | PRN
  Administered 2019-11-14: 2 g via INTRAVENOUS

## 2019-11-14 MED ORDER — EPHEDRINE SULFATE 50 MG/ML IJ SOLN
INTRAMUSCULAR | Status: DC | PRN
  Administered 2019-11-14 (×4): 5 mg via INTRAVENOUS

## 2019-11-14 MED ORDER — BELLADONNA ALKALOIDS-OPIUM 16.2-60 MG RE SUPP
1.0000 | Freq: Once | RECTAL | Status: AC
  Administered 2019-11-14: 1 via RECTAL

## 2019-11-14 MED ORDER — IOHEXOL 180 MG/ML  SOLN
INTRAMUSCULAR | Status: DC | PRN
  Administered 2019-11-14: 19:00:00 40 mL

## 2019-11-14 MED ORDER — SUCCINYLCHOLINE CHLORIDE 20 MG/ML IJ SOLN
INTRAMUSCULAR | Status: DC | PRN
  Administered 2019-11-14: 120 mg via INTRAVENOUS

## 2019-11-14 MED ORDER — SUCCINYLCHOLINE CHLORIDE 20 MG/ML IJ SOLN
INTRAMUSCULAR | Status: AC
  Filled 2019-11-14: qty 1

## 2019-11-14 MED ORDER — DEXAMETHASONE SODIUM PHOSPHATE 10 MG/ML IJ SOLN
INTRAMUSCULAR | Status: AC
  Filled 2019-11-14: qty 1

## 2019-11-14 MED ORDER — PROPOFOL 10 MG/ML IV BOLUS
INTRAVENOUS | Status: DC | PRN
  Administered 2019-11-14: 200 mg via INTRAVENOUS

## 2019-11-14 MED ORDER — OXYBUTYNIN CHLORIDE 5 MG PO TABS
5.0000 mg | ORAL_TABLET | Freq: Once | ORAL | Status: AC
  Filled 2019-11-14: qty 1

## 2019-11-14 MED ORDER — ROCURONIUM BROMIDE 50 MG/5ML IV SOLN
INTRAVENOUS | Status: AC
  Filled 2019-11-14: qty 1

## 2019-11-14 MED ORDER — FENTANYL CITRATE (PF) 100 MCG/2ML IJ SOLN: 25.0000 ug | INTRAMUSCULAR | Status: DC | PRN

## 2019-11-14 MED ORDER — DIPHENHYDRAMINE HCL 12.5 MG/5ML PO ELIX
12.5000 mg | ORAL_SOLUTION | Freq: Four times a day (QID) | ORAL | Status: DC | PRN
  Administered 2019-11-14: 12.5 mg via ORAL
  Filled 2019-11-14 (×3): qty 5

## 2019-11-14 MED ORDER — ONDANSETRON HCL 4 MG/2ML IJ SOLN: 4.0000 mg | Freq: Once | INTRAMUSCULAR | Status: DC | PRN

## 2019-11-14 MED ORDER — BELLADONNA ALKALOIDS-OPIUM 16.2-60 MG RE SUPP
RECTAL | Status: AC
  Filled 2019-11-14: qty 1

## 2019-11-14 SURGICAL SUPPLY — 28 items
BAG DRAIN CYSTO-URO LG1000N (MISCELLANEOUS) ×3 IMPLANT
BASKET ZERO TIP 1.9FR (BASKET) ×2 IMPLANT
BRUSH SCRUB EZ 1% IODOPHOR (MISCELLANEOUS) ×3 IMPLANT
CATH URETL 5X70 OPEN END (CATHETERS) ×2 IMPLANT
CNTNR SPEC 2.5X3XGRAD LEK (MISCELLANEOUS)
CONT SPEC 4OZ STER OR WHT (MISCELLANEOUS)
CONTAINER SPEC 2.5X3XGRAD LEK (MISCELLANEOUS) IMPLANT
DRAPE UTILITY 15X26 TOWEL STRL (DRAPES) ×3 IMPLANT
FIBER LASER TRACTIP 200 (UROLOGICAL SUPPLIES) ×3 IMPLANT
GLOVE BIO SURGEON STRL SZ8 (GLOVE) ×3 IMPLANT
GOWN STRL REUS W/ TWL LRG LVL3 (GOWN DISPOSABLE) ×1 IMPLANT
GOWN STRL REUS W/ TWL XL LVL3 (GOWN DISPOSABLE) ×1 IMPLANT
GOWN STRL REUS W/TWL LRG LVL3 (GOWN DISPOSABLE) ×2
GOWN STRL REUS W/TWL XL LVL3 (GOWN DISPOSABLE) ×2
GUIDEWIRE STR DUAL SENSOR (WIRE) ×5 IMPLANT
INFUSOR MANOMETER BAG 3000ML (MISCELLANEOUS) ×3 IMPLANT
INTRODUCER DILATOR DOUBLE (INTRODUCER) IMPLANT
KIT TURNOVER CYSTO (KITS) ×3 IMPLANT
PACK CYSTO AR (MISCELLANEOUS) ×3 IMPLANT
SET CYSTO W/LG BORE CLAMP LF (SET/KITS/TRAYS/PACK) ×3 IMPLANT
SHEATH URETERAL 12FRX35CM (MISCELLANEOUS) IMPLANT
SOL .9 NS 3000ML IRR  AL (IV SOLUTION) ×2
SOL .9 NS 3000ML IRR UROMATIC (IV SOLUTION) ×1 IMPLANT
STENT URET 6FRX24 CONTOUR (STENTS) IMPLANT
STENT URET 6FRX26 CONTOUR (STENTS) ×2 IMPLANT
SURGILUBE 2OZ TUBE FLIPTOP (MISCELLANEOUS) ×3 IMPLANT
VALVE UROSEAL ADJ ENDO (VALVE) ×2 IMPLANT
WATER STERILE IRR 1000ML POUR (IV SOLUTION) ×3 IMPLANT

## 2019-11-14 NOTE — Progress Notes (Signed)
Patient ID: Donald Estrada, male   DOB: 1981/06/20, 39 y.o.   MRN: [id number] Received chat message from Dr Virl Diamond. Procedure went well. Pt is post right ureteral stent placement. Stent will be pulled out on Friday by pt. F/u will be arranged by Dr Heywood Footman office in 2-3 weeks. Ok to d/c home today.

## 2019-11-14 NOTE — Progress Notes (Signed)
[facility]  - [place] at [facility]   PATIENT NAME: Donald Estrada    MR#:  [id number]  DATE OF BIRTH:  1980/10/12  SUBJECTIVE:  came in with right-sided flank pain and found to have kidney stone along with right mid ureter stone. Continues to have pain on and off. No vomiting. Complains of itching all over.  REVIEW OF SYSTEMS:   Review of Systems  Constitutional: Negative for chills, fever and weight loss.  HENT: Negative for ear discharge, ear pain and nosebleeds.   Eyes: Negative for blurred vision, pain and discharge.  Respiratory: Negative for sputum production, shortness of breath, wheezing and stridor.   Cardiovascular: Negative for chest pain, palpitations, orthopnea and PND.  Gastrointestinal: Positive for abdominal pain. Negative for diarrhea, nausea and vomiting.  Genitourinary: Negative for frequency and urgency.  Musculoskeletal: Negative for back pain and joint pain.  Skin: Positive for itching.  Neurological: Negative for sensory change, speech change, focal weakness and weakness.  Psychiatric/Behavioral: Negative for depression and hallucinations. The patient is not nervous/anxious.    Tolerating Diet:NPO Tolerating PT: not needed  DRUG ALLERGIES:  No Known Allergies  VITALS:  Blood pressure 120/73, pulse 61, temperature 98.4 F (36.9 C), temperature source Oral, resp. rate 16, height 5\' 10"  (1.778 m), weight 97.1 kg, SpO2 96 %.  PHYSICAL EXAMINATION:   Physical Exam  GENERAL:  39 y.o.-year-old patient lying in the bed with no acute distress.  EYES: Pupils equal, round, reactive to light and accommodation. No scleral icterus.   HEENT: Head atraumatic, normocephalic. Oropharynx and nasopharynx clear.  NECK:  Supple, no jugular venous distention. No thyroid enlargement, no tenderness.  LUNGS: Normal breath sounds bilaterally, no wheezing, rales, rhonchi. No use of accessory muscles of respiration.  CARDIOVASCULAR: S1, S2 normal. No murmurs,  rubs, or gallops.  ABDOMEN: Soft, right flank tenderness, nondistended. Bowel sounds present. No organomegaly or mass.  EXTREMITIES: No cyanosis, clubbing or edema b/l.    NEUROLOGIC: Cranial nerves II through XII are intact. No focal Motor or sensory deficits b/l.   PSYCHIATRIC:  patient is alert and oriented x 3.  SKIN: No obvious rash, lesion, or ulcer.   LABORATORY PANEL:  CBC Recent Labs  Lab 11/13/19 1954  WBC 10.2  HGB 12.9*  HCT 38.7*  PLT 255    Chemistries  Recent Labs  Lab 11/13/19 1954  NA 139  K 3.7  CL 105  CO2 26  GLUCOSE 122*  BUN 15  CREATININE 1.36*  CALCIUM 9.2  AST 61*  ALT 103*  ALKPHOS 33*  BILITOT 0.8   Cardiac Enzymes No results for input(s): TROPONINI in the last 168 hours. RADIOLOGY:  DG Abd 1 View  Result Date: 11/13/2019 CLINICAL DATA:  Renal colic EXAM: ABDOMEN - 1 VIEW COMPARISON:  11/13/2019 FINDINGS: Two supine frontal views of the abdomen and pelvis are obtained. Faint 5 mm right renal calculus is again identified. The mid right ureteral calculus seen on CT is not well visualized on this exam, likely due to overlying bony structures and bowel gas. No bowel obstruction or ileus. IMPRESSION: 1. The obstructing right ureteral calculus seen on previous CT not identified on this exam, possibly obscured by bony structures and bowel gas. 2. Faint 5 mm right renal calculus as above. Electronically Signed   By: 11/15/2019 M.D.   On: 11/13/2019 20:50   CT Renal Stone Study  Result Date: 11/13/2019 CLINICAL DATA:  Right flank region pain EXAM: CT ABDOMEN AND PELVIS WITHOUT CONTRAST TECHNIQUE:  Multidetector CT imaging of the abdomen and pelvis was performed following the standard protocol without oral or IV contrast. COMPARISON:  August 18, 2015 FINDINGS: Lower chest: There is slight posterior left base atelectasis. Lung bases otherwise are clear. Hepatobiliary: There is hepatic steatosis. No focal liver lesions are appreciable on this  noncontrast enhanced study. Gallbladder wall is not appreciably thickened. There is no biliary duct dilatation. Pancreas: There is no pancreatic mass or inflammatory focus. Spleen: No splenic lesions are evident. Adrenals/Urinary Tract: Adrenals bilaterally appear normal. Right kidney is subtly edematous. There is mild hydronephrosis on the right. There is no hydronephrosis on the left. There is no appreciable renal mass on either side. There is a calculus in the lower pole of the right kidney measuring 7 x 5 mm. There is no intrarenal calculus on the left. There is a calculus in the mid right ureter at the S1 level measuring 5 x 5 mm with mild focal ureteral edema in this area. No other ureteral calculi are evident. Urinary bladder is midline with wall thickness within normal limits. Stomach/Bowel: There are sigmoid diverticula without diverticulitis. There is no appreciable bowel wall or mesenteric thickening. Terminal ileum appears normal. No evident bowel obstruction. No free air or portal venous air. Vascular/Lymphatic: There is no abdominal aortic aneurysm. No vascular lesions are evident on this noncontrast enhanced study. There is no evident adenopathy in the abdomen or pelvis. A partially calcified subcentimeter lymph node in the left mid pelvis is considered nonspecific. Reproductive: Prostate and seminal vesicles are normal in size and contour. No evident pelvic mass. Other: Appendix appears normal. There is no evident abscess or ascites in the abdomen or pelvis. Postoperative change noted in the right inguinal region. No evident bowel containing hernia. Musculoskeletal: No blastic or lytic bone lesions. No intramuscular lesion evident. IMPRESSION: 1. There is a 5 x 5 mm calculus at the S1 level in the right ureter with mild hydronephrosis on the right. 2.  Nonobstructing 7 x 5 mm calculus lower pole right kidney. 3. No bowel obstruction. There are sigmoid diverticula without diverticulitis. No abscess in  the abdomen or pelvis. Appendix appears normal. 4.  Hepatic steatosis. Electronically Signed   By: Lowella Grip III M.D.   On: 11/13/2019 11:28   ASSESSMENT AND PLAN:  Donald Estrada is a 39 y.o. male with medical history significant of gout, hypertriglyceridemia, fatty liver, prediabetes, history of kidney stone, came with a chief complaint of recurrent flank pain and nausea.  Right flank pain secondary to renal/ureteral colic -CT renal stone study showed 5 x 5 mm calculus at the S1 level in the right ureter with mild hydronephrosis on the right,Nonobstructive 7-5 millimetric calculus lower pole right kidney -Denies any fever or chills vomiting has some nausea -Normal white count hemoglobin 12.9 platelets 255 -Urinalysis no UTI -continue IV fluids, IV PRN and PO pain meds -Dr. Bernardo Heater urology consult-- planning for stent placement/stone removal later today. -Patient NPO  Acute renal failure-- mild, obstructive neuropathy -creatinine 1.36 -continue IV fluids, monitor input output  History of gout On allopurinol and colchicine Plan check uric acid level  Hyperlipidemia  Resume fenofibrate  GERD PPI  Procedures: Family communication : patient Consults : urology Dr. Bernardo Heater Discharge Disposition : home either later today or tomorrow depending on when patient gets back from his procedure CODE STATUS: full DVT Prophylaxis : SCD Barriers to discharge: pending urology procedure  TOTAL TIME TAKING CARE OF THIS PATIENT: 30 minutes.  >50% time spent on counselling and  coordination of care  Note: This dictation was prepared with Dragon dictation along with smaller phrase technology. Any transcriptional errors that result from this process are unintentional.  Enedina Finner M.D    [facility]   CC: Primary care physician; Donita Brooks, MDPatient ID: Donald Estrada, male   DOB: [birth year], 39 y.o.   MRN: [id number]

## 2019-11-14 NOTE — Anesthesia Preprocedure Evaluation (Signed)
Anesthesia Evaluation  Patient identified by MRN, date of birth, ID band Patient awake    Reviewed: Allergy & Precautions, H&P , NPO status , Patient's Chart, lab work & pertinent test results  Airway Mallampati: II  TM Distance: >3 FB Neck ROM: Full    Dental no notable dental hx. (+) Teeth Intact, Dental Advisory Given   Pulmonary neg pulmonary ROS,    Pulmonary exam normal breath sounds clear to auscultation       Cardiovascular negative cardio ROS   Rhythm:Regular Rate:Normal     Neuro/Psych negative neurological ROS  negative psych ROS   GI/Hepatic negative GI ROS, Neg liver ROS,   Endo/Other  negative endocrine ROS  Renal/GU negative Renal ROSstones  negative genitourinary   Musculoskeletal   Abdominal   Peds negative pediatric ROS (+)  Hematology negative hematology ROS (+)   Anesthesia Other Findings Past Medical History: No date: Allergy     Comment:  seasonal No date: Elevated LFTs No date: Gout No date: Hypertriglyceridemia No date: Prediabetes  Reproductive/Obstetrics negative OB ROS                             Anesthesia Physical  Anesthesia Plan  ASA: II  Anesthesia Plan: General   Post-op Pain Management:    Induction: Intravenous  PONV Risk Score and Plan:   Airway Management Planned: Oral ETT  Additional Equipment:   Intra-op Plan:   Post-operative Plan: Extubation in OR  Informed Consent: I have reviewed the patients History and Physical, chart, labs and discussed the procedure including the risks, benefits and alternatives for the proposed anesthesia with the patient or authorized representative who has indicated his/her understanding and acceptance.     Dental advisory given  Plan Discussed with: CRNA and Surgeon  Anesthesia Plan Comments:         Anesthesia Quick Evaluation

## 2019-11-14 NOTE — Discharge Summary (Signed)
[facility] - [place] at [facility]   PATIENT NAME: Donald Estrada    MR#:  [id number]  DATE OF BIRTH:  Jan 08, 1981  DATE OF ADMISSION:  11/13/2019 ADMITTING PHYSICIAN: Victorino Sparrow Cristescu, MD  DATE OF DISCHARGE: 11/14/2019  PRIMARY CARE PHYSICIAN: Donita Brooks, MD    ADMISSION DIAGNOSIS:  Kidney stone [N20.0]  DISCHARGE DIAGNOSIS:  Right Flank pain due to right Ureterolithiasis with mild hydronephrosis s/p Cystoscopy with ureteral stent placement and laser lithotripsy Acute Renal failure due to Obstructive Uropathy  SECONDARY DIAGNOSIS:   Past Medical History:  Diagnosis Date  . Allergy    seasonal  . Elevated LFTs   . Gout   . Hypertriglyceridemia   . Prediabetes     HOSPITAL COURSE:  Donald Pulliamis a 39 y.o.malewith medical history significant ofgout, hypertriglyceridemia, fatty liver, prediabetes, history of kidney stone, came with a chief complaint of recurrent flank pain and nausea.  Right flank pain secondary to renal/ureteral colic -CT renal stone study showed 5 x 5 mm calculus at the S1 level in the right ureter with mild hydronephrosis on the right,Nonobstructive 7-5 millimetric calculus lower pole right kidney -Denies any fever or chills vomiting has some nausea -Normal white count hemoglobin 12.9 platelets 255 -Urinalysis no UTI -Recieved IV fluids, IV PRN and PO pain meds -Dr. Lonna Cobb urology consult-- s/p rigth ureteral stent placement/stone removal with laser lithotripsy. -Post -op instructions per Dr Lonna Cobb. F/u Urology in 2-3 weeks. -cont flomax and prn oxybutinin  Acute renal failure-- mild, obstructive uropathy -creatinine 1.36 -recieved IV fluids  History of gout On allopurinol and colchicine  Hyperlipidemia  Resume fenofibrate  GERD PPI  H/o elevated LFT's--defer with PCP to f/u  Procedures:cystoscopy with right ureteral stent placement with laser lthotripsy stone removal Family communication :  patient, wife Verlon Au Consults : urology Dr. Lonna Cobb Discharge Disposition : home  CODE STATUS: full DVT Prophylaxis : SCD Barriers to discharge: none  CONSULTS OBTAINED:  Treatment Team:  Riki Altes, MD  DRUG ALLERGIES:  No Known Allergies  DISCHARGE MEDICATIONS:   Allergies as of 11/14/2019   No Known Allergies     Medication List    STOP taking these medications   SYRINGE-NEEDLE (DISP) 3 ML 23G X 1" 3 ML Misc Commonly known as: B-D 3CC LUER-LOK SYR 23GX1"     TAKE these medications   allopurinol 300 MG tablet Commonly known as: ZYLOPRIM TAKE 1 TABLET BY MOUTH EVERY DAY   Colchicine 0.6 MG Caps Take 0.6 mg by mouth daily. What changed:   when to take this  reasons to take this   fenofibrate 160 MG tablet TAKE 1 TABLET BY MOUTH EVERY DAY   ketorolac 10 MG tablet Commonly known as: TORADOL Take 1 tablet (10 mg total) by mouth every 6 (six) hours as needed.   omega-3 acid ethyl esters 1 g capsule Commonly known as: LOVAZA Take 2 g by mouth at bedtime.   omeprazole 20 MG capsule Commonly known as: PRILOSEC Take 20 mg by mouth daily as needed (heartburn and indigestion.).   oxybutynin 5 MG tablet Commonly known as: DITROPAN 1 tab tid prn frequency,urgency, bladder spasm   oxyCODONE-acetaminophen 7.5-325 MG tablet Commonly known as: Percocet Take 1 tablet by mouth every 6 (six) hours as needed.   tamsulosin 0.4 MG Caps capsule Commonly known as: Flomax Take 1 capsule (0.4 mg total) by mouth daily.   testosterone cypionate 200 MG/ML injection Commonly known as: DEPOTESTOSTERONE CYPIONATE INJECT 1 ML INTO THE MUSCLE EVERY  53 DAYS       If you experience worsening of your admission symptoms, develop shortness of breath, life threatening emergency, suicidal or homicidal thoughts you must seek medical attention immediately by calling 911 or calling your MD immediately  if symptoms less severe.  You Must read complete instructions/literature along  with all the possible adverse reactions/side effects for all the Medicines you take and that have been prescribed to you. Take any new Medicines after you have completely understood and accept all the possible adverse reactions/side effects.   Please note  You were cared for by a hospitalist during your hospital stay. If you have any questions about your discharge medications or the care you received while you were in the hospital after you are discharged, you can call the unit and asked to speak with the hospitalist on call if the hospitalist that took care of you is not available. Once you are discharged, your primary care physician will handle any further medical issues. Please note that NO REFILLS for any discharge medications will be authorized once you are discharged, as it is imperative that you return to your primary care physician (or establish a relationship with a primary care physician if you do not have one) for your aftercare needs so that they can reassess your need for medications and monitor your lab values.  DATA REVIEW:   CBC  Recent Labs  Lab 11/13/19 1954  WBC 10.2  HGB 12.9*  HCT 38.7*  PLT 255    Chemistries  Recent Labs  Lab 11/13/19 1954  NA 139  K 3.7  CL 105  CO2 26  GLUCOSE 122*  BUN 15  CREATININE 1.36*  CALCIUM 9.2  AST 61*  ALT 103*  ALKPHOS 33*  BILITOT 0.8    Microbiology Results   Recent Results (from the past 240 hour(s))  Respiratory Panel by RT PCR (Flu A&B, Covid) - Nasopharyngeal Swab     Status: None   Collection Time: 11/13/19  8:54 PM   Specimen: Nasopharyngeal Swab  Result Value Ref Range Status   SARS Coronavirus 2 by RT PCR NEGATIVE NEGATIVE Final    Comment: (NOTE) SARS-CoV-2 target nucleic acids are NOT DETECTED. The SARS-CoV-2 RNA is generally detectable in upper respiratoy specimens during the acute phase of infection. The lowest concentration of SARS-CoV-2 viral copies this assay can detect is 131 copies/mL. A negative  result does not preclude SARS-Cov-2 infection and should not be used as the sole basis for treatment or other patient management decisions. A negative result may occur with  improper specimen collection/handling, submission of specimen other than nasopharyngeal swab, presence of viral mutation(s) within the areas targeted by this assay, and inadequate number of viral copies (<131 copies/mL). A negative result must be combined with clinical observations, patient history, and epidemiological information. The expected result is Negative. Fact Sheet for Patients:  PinkCheek.be Fact Sheet for Healthcare Providers:  GravelBags.it This test is not yet ap proved or cleared by the [place] FDA and  has been authorized for detection and/or diagnosis of SARS-CoV-2 by FDA under an Emergency Use Authorization (EUA). This EUA will remain  in effect (meaning this test can be used) for the duration of the COVID-19 declaration under Section 564(b)(1) of the Act, 21 U.S.C. section 360bbb-3(b)(1), unless the authorization is terminated or revoked sooner.    Influenza A by PCR NEGATIVE NEGATIVE Final   Influenza B by PCR NEGATIVE NEGATIVE Final    Comment: (NOTE) The Xpert Xpress SARS-CoV-2/FLU/RSV assay  is intended as an aid in  the diagnosis of influenza from Nasopharyngeal swab specimens and  should not be used as a sole basis for treatment. Nasal washings and  aspirates are unacceptable for Xpert Xpress SARS-CoV-2/FLU/RSV  testing. Fact Sheet for Patients: https://www.moore.com/ Fact Sheet for Healthcare Providers: https://www.young.biz/ This test is not yet approved or cleared by the [place] FDA and  has been authorized for detection and/or diagnosis of SARS-CoV-2 by  FDA under an Emergency Use Authorization (EUA). This EUA will remain  in effect (meaning this test can be used) for the  duration of the  Covid-19 declaration under Section 564(b)(1) of the Act, 21  U.S.C. section 360bbb-3(b)(1), unless the authorization is  terminated or revoked. Performed at [facility], [address] [place], [place] [address]   Respiratory Panel by PCR     Status: None   Collection Time: 11/13/19  8:54 PM   Specimen: Nasopharyngeal Swab; Respiratory  Result Value Ref Range Status   Adenovirus NOT DETECTED NOT DETECTED Final   Coronavirus 229E NOT DETECTED NOT DETECTED Final    Comment: (NOTE) The Coronavirus on the Respiratory Panel, DOES NOT test for the novel  Coronavirus (2019 nCoV)    Coronavirus HKU1 NOT DETECTED NOT DETECTED Final   Coronavirus NL63 NOT DETECTED NOT DETECTED Final   Coronavirus OC43 NOT DETECTED NOT DETECTED Final   Metapneumovirus NOT DETECTED NOT DETECTED Final   Rhinovirus / Enterovirus NOT DETECTED NOT DETECTED Final   Influenza A NOT DETECTED NOT DETECTED Final   Influenza B NOT DETECTED NOT DETECTED Final   Parainfluenza Virus 1 NOT DETECTED NOT DETECTED Final   Parainfluenza Virus 2 NOT DETECTED NOT DETECTED Final   Parainfluenza Virus 3 NOT DETECTED NOT DETECTED Final   Parainfluenza Virus 4 NOT DETECTED NOT DETECTED Final   Respiratory Syncytial Virus NOT DETECTED NOT DETECTED Final   Bordetella pertussis NOT DETECTED NOT DETECTED Final   Chlamydophila pneumoniae NOT DETECTED NOT DETECTED Final   Mycoplasma pneumoniae NOT DETECTED NOT DETECTED Final    Comment: Performed at [facility], [address] [place], [place] [address]    RADIOLOGY:  DG Abd 1 View  Result Date: 11/13/2019 CLINICAL DATA:  Renal colic EXAM: ABDOMEN - 1 VIEW COMPARISON:  11/13/2019 FINDINGS: Two supine frontal views of the abdomen and pelvis are obtained. Faint 5 mm right renal calculus is again identified. The mid right ureteral calculus seen on CT is not well visualized on this exam, likely due to overlying bony structures and bowel gas. No bowel  obstruction or ileus. IMPRESSION: 1. The obstructing right ureteral calculus seen on previous CT not identified on this exam, possibly obscured by bony structures and bowel gas. 2. Faint 5 mm right renal calculus as above. Electronically Signed   By: Sharlet Salina M.D.   On: 11/13/2019 20:50   DG OR UROLOGY CYSTO IMAGE ([facility] ONLY)  Result Date: 11/14/2019 There is no interpretation for this exam.  This order is for images obtained during a surgical procedure.  Please See "Surgeries" Tab for more information regarding the procedure.   CT Renal Stone Study  Result Date: 11/13/2019 CLINICAL DATA:  Right flank region pain EXAM: CT ABDOMEN AND PELVIS WITHOUT CONTRAST TECHNIQUE: Multidetector CT imaging of the abdomen and pelvis was performed following the standard protocol without oral or IV contrast. COMPARISON:  August 18, 2015 FINDINGS: Lower chest: There is slight posterior left base atelectasis. Lung bases otherwise are clear. Hepatobiliary: There is hepatic steatosis.  No focal liver lesions are appreciable on this noncontrast enhanced study. Gallbladder wall is not appreciably thickened. There is no biliary duct dilatation. Pancreas: There is no pancreatic mass or inflammatory focus. Spleen: No splenic lesions are evident. Adrenals/Urinary Tract: Adrenals bilaterally appear normal. Right kidney is subtly edematous. There is mild hydronephrosis on the right. There is no hydronephrosis on the left. There is no appreciable renal mass on either side. There is a calculus in the lower pole of the right kidney measuring 7 x 5 mm. There is no intrarenal calculus on the left. There is a calculus in the mid right ureter at the S1 level measuring 5 x 5 mm with mild focal ureteral edema in this area. No other ureteral calculi are evident. Urinary bladder is midline with wall thickness within normal limits. Stomach/Bowel: There are sigmoid diverticula without diverticulitis. There is no appreciable bowel wall or  mesenteric thickening. Terminal ileum appears normal. No evident bowel obstruction. No free air or portal venous air. Vascular/Lymphatic: There is no abdominal aortic aneurysm. No vascular lesions are evident on this noncontrast enhanced study. There is no evident adenopathy in the abdomen or pelvis. A partially calcified subcentimeter lymph node in the left mid pelvis is considered nonspecific. Reproductive: Prostate and seminal vesicles are normal in size and contour. No evident pelvic mass. Other: Appendix appears normal. There is no evident abscess or ascites in the abdomen or pelvis. Postoperative change noted in the right inguinal region. No evident bowel containing hernia. Musculoskeletal: No blastic or lytic bone lesions. No intramuscular lesion evident. IMPRESSION: 1. There is a 5 x 5 mm calculus at the S1 level in the right ureter with mild hydronephrosis on the right. 2.  Nonobstructing 7 x 5 mm calculus lower pole right kidney. 3. No bowel obstruction. There are sigmoid diverticula without diverticulitis. No abscess in the abdomen or pelvis. Appendix appears normal. 4.  Hepatic steatosis. Electronically Signed   By: Bretta Bang III M.D.   On: 11/13/2019 11:28     CODE STATUS:     Code Status Orders  (From admission, onward)         Start     Ordered   11/13/19 1941  Full code  Continuous     11/13/19 1940        Code Status History    Date Active Date Inactive Code Status Order ID Comments User Context   06/21/2012 2140 06/22/2012 1422 Full Code [id number]  Nichola Sizer, RN Inpatient   Advance Care Planning Activity       TOTAL TIME TAKING CARE OF THIS PATIENT: *35 minutes.    Enedina Finner M.D  [facility]    CC: Primary care physician; Donita Brooks, MD

## 2019-11-14 NOTE — Transfer of Care (Signed)
Immediate Anesthesia Transfer of Care Note  Patient: Donald Estrada  Procedure(s) Performed: CYSTOSCOPY/URETEROSCOPY/HOLMIUM LASER/STENT PLACEMENT (Right Ureter) CYSTOSCOPY WITH RETROGRADE PYELOGRAM (Right Ureter)  Patient Location: PACU  Anesthesia Type:General  Level of Consciousness: drowsy  Airway & Oxygen Therapy: Patient Spontanous Breathing and Patient connected to face mask oxygen  Post-op Assessment: Report given to RN and Post -op Vital signs reviewed and stable  Post vital signs: Reviewed and stable  Last Vitals:  Vitals Value Taken Time  BP 121/69 11/14/19 1932  Temp    Pulse 71 11/14/19 1935  Resp 16 11/14/19 1935  SpO2 100 % 11/14/19 1935  Vitals shown include unvalidated device data.  Last Pain:  Vitals:   11/14/19 1409  TempSrc:   PainSc: 10-Worst pain ever         Complications: No apparent anesthesia complications

## 2019-11-14 NOTE — H&P (Signed)
History and Physical    Donald Estrada ION:[id number] DOB: 01-27-1981 DOA: 11/13/2019  PCP: Donita Brooks, MD    Patient coming from: Home    Chief Complaint: Right flank pain nausea  HPI: Donald Estrada is a 39 y.o. male with medical history significant of gout, hypertriglyceridemia, fatty liver, prediabetes, history of kidney stone, came with a chief complaint of recurrent flank pain and nausea. Patient was seen in the emergency room earlier and apparently he passed the stone and the pain subsided Pain came back and had a CT scan which showed an obstructive 5 to 5 mm stone right ureter with mild hydronephrosis Patient has also nonobstructive stone right proximal ureter 7.per 5 mm stone Patient denies any fever chills vomiting Was sent home from emergency room a returns with recurrent pain ED Course:  HPI/ED note Donald Estrada is a 39 y.o. male patient presents with right flank pain that began yesterday.  Reported to the ED yesterday but state while waiting to be seen he voided and the stone passed.  Patient afebrile relief so he did not wait to be evaluated.  Patient this morning he working again with right flank pain which is more pronounced than the one yesterday.  Patient denies urinary complaint.  Patient rates pain as 8/10.  Describes the pain as "sharp".  Patient states feels like the pain is radiating to his scrotum.  No palliative measure for complaint.  Patient presents with right flank pain and hematuria.  Discussed CT findings with patient showing a 5 x5 mm renal calculus at the distal right ureter.  Patient also had a 7 x 5 mm calculus in the lower pole of the right kidney.  Patient is moderate relief with IV fluids, Dilaudid, and Toradol.  Patient discharge instructions were given.  Patient given prescription for Percocets and continue Toradol and oral hydration.  Patient given strainer to monitor passage of stone.  Patient advised follow-up with urology secondary to the  nonobstructive stone  lower right kidney pole Review of Systems: As per HPI otherwise 10 point review of systems negative.  With the exception of right flank pain and nausea  Past Medical History:  Diagnosis Date  . Allergy    seasonal  . Elevated LFTs   . Gout   . Hypertriglyceridemia   . Prediabetes     Past Surgical History:  Procedure Laterality Date  . INGUINAL HERNIA REPAIR     right     reports that he has never smoked. His smokeless tobacco use includes chew and snuff. He reports current alcohol use. He reports that he does not use drugs.  No Known Allergies  Family History  Adopted: Yes  Problem Relation Age of Onset  . Heart disease Maternal Grandmother      Prior to Admission medications   Medication Sig Start Date End Date Taking? Authorizing Provider  allopurinol (ZYLOPRIM) 300 MG tablet TAKE 1 TABLET BY MOUTH EVERY DAY 06/05/19   Donita Brooks, MD  Colchicine 0.6 MG CAPS Take 0.6 mg by mouth daily. Patient taking differently: Take 0.6 mg by mouth daily as needed (gout).  01/03/16   Donita Brooks, MD  fenofibrate 160 MG tablet TAKE 1 TABLET BY MOUTH EVERY DAY 06/12/19   Donita Brooks, MD  ketorolac (TORADOL) 10 MG tablet Take 1 tablet (10 mg total) by mouth every 6 (six) hours as needed. 11/13/19   Joni Reining, PA-C  omeprazole (PRILOSEC) 20 MG capsule Take 20 mg by mouth daily  as needed (heartburn and indigestion.).     [provider]  oxyCODONE-acetaminophen (PERCOCET) 7.5-325 MG tablet Take 1 tablet by mouth every 6 (six) hours as needed. 11/13/19   Sable Feil, PA-C  SYRINGE-NEEDLE, DISP, 3 ML (B-D 3CC LUER-LOK SYR 23GX1") 23G X 1" 3 ML MISC USE TO INJECT TESTOSTERONE IM 02/15/19   Susy Frizzle, MD  tamsulosin (FLOMAX) 0.4 MG CAPS capsule Take 1 capsule (0.4 mg total) by mouth daily. 11/13/19   Sable Feil, PA-C  testosterone cypionate (DEPOTESTOSTERONE CYPIONATE) 200 MG/ML injection INJECT 1 ML INTO THE MUSCLE EVERY 14 DAYS  10/31/19   Susy Frizzle, MD    Physical Exam: Vitals:   11/13/19 1755 11/13/19 1758 11/13/19 2022  BP: 134/85  120/68  Pulse: 75  79  Resp: 15  18  Temp: 98.2 F (36.8 C)  98 F (36.7 C)  TempSrc: Oral  Oral  SpO2: 97%  97%  Weight:  97.1 kg   Height:  5\' 10"  (1.778 m)     Constitutional: NAD, calm, comfortable Vitals:   11/13/19 1755 11/13/19 1758 11/13/19 2022  BP: 134/85  120/68  Pulse: 75  79  Resp: 15  18  Temp: 98.2 F (36.8 C)  98 F (36.7 C)  TempSrc: Oral  Oral  SpO2: 97%  97%  Weight:  97.1 kg   Height:  5\' 10"  (1.778 m)    Eyes: PERRL, lids and conjunctivae normal ENMT: Mucous membranes are moist. Posterior pharynx clear of any exudate or lesions.Normal dentition.  Neck: normal, supple, no masses, no thyromegaly Respiratory: clear to auscultation bilaterally, no wheezing, no crackles. Normal respiratory effort. No accessory muscle use.  Cardiovascular: Regular rate and rhythm, no murmurs / rubs / gallops. No extremity edema. 2+ pedal pulses. No carotid bruits.  Abdomen: Right flank tenderness, no masses palpated. No hepatosplenomegaly. Bowel sounds positive.  Musculoskeletal: no clubbing / cyanosis. No joint deformity upper and lower extremities. Good ROM, no contractures. Normal muscle tone.  Skin: no rashes, lesions, ulcers. No induration Neurologic: CN 2-12 grossly intact. Sensation intact, DTR normal. Strength 5/5 in all 4.  Psychiatric: Normal judgment and insight. Alert and oriented x 3. Normal mood.    Labs on Admission: I have personally reviewed following labs and imaging studies  CBC: Recent Labs  Lab 11/13/19 1954  WBC 10.2  NEUTROABS 7.5  HGB 12.9*  HCT 38.7*  MCV 94.9  PLT 619   Basic Metabolic Panel: Recent Labs  Lab 11/13/19 1954  NA 139  K 3.7  CL 105  CO2 26  GLUCOSE 122*  BUN 15  CREATININE 1.36*  CALCIUM 9.2   GFR: Estimated Creatinine Clearance: 85.2 mL/min (A) (by C-G formula based on SCr of 1.36 mg/dL  (H)). Liver Function Tests: Recent Labs  Lab 11/13/19 1954  AST 61*  ALT 103*  ALKPHOS 33*  BILITOT 0.8  PROT 6.6  ALBUMIN 4.4   No results for input(s): LIPASE, AMYLASE in the last 168 hours. No results for input(s): AMMONIA in the last 168 hours. Coagulation Profile: No results for input(s): INR, PROTIME in the last 168 hours. Cardiac Enzymes: No results for input(s): CKTOTAL, CKMB, CKMBINDEX, TROPONINI in the last 168 hours. BNP (last 3 results) No results for input(s): PROBNP in the last 8760 hours. HbA1C: No results for input(s): HGBA1C in the last 72 hours. CBG: No results for input(s): GLUCAP in the last 168 hours. Lipid Profile: No results for input(s): CHOL, HDL, LDLCALC, TRIG, CHOLHDL, LDLDIRECT in  the last 72 hours. Thyroid Function Tests: No results for input(s): TSH, T4TOTAL, FREET4, T3FREE, THYROIDAB in the last 72 hours. Anemia Panel: No results for input(s): VITAMINB12, FOLATE, FERRITIN, TIBC, IRON, RETICCTPCT in the last 72 hours. Urine analysis:    Component Value Date/Time   COLORURINE YELLOW (A) 11/13/2019 2014   APPEARANCEUR CLEAR (A) 11/13/2019 2014   LABSPEC 1.009 11/13/2019 2014   PHURINE 7.0 11/13/2019 2014   GLUCOSEU NEGATIVE 11/13/2019 2014   HGBUR MODERATE (A) 11/13/2019 2014   BILIRUBINUR NEGATIVE 11/13/2019 2014   KETONESUR NEGATIVE 11/13/2019 2014   PROTEINUR NEGATIVE 11/13/2019 2014   NITRITE NEGATIVE 11/13/2019 2014   LEUKOCYTESUR NEGATIVE 11/13/2019 2014    Radiological Exams on Admission: DG Abd 1 View  Result Date: 11/13/2019 CLINICAL DATA:  Renal colic EXAM: ABDOMEN - 1 VIEW COMPARISON:  11/13/2019 FINDINGS: Two supine frontal views of the abdomen and pelvis are obtained. Faint 5 mm right renal calculus is again identified. The mid right ureteral calculus seen on CT is not well visualized on this exam, likely due to overlying bony structures and bowel gas. No bowel obstruction or ileus. IMPRESSION: 1. The obstructing right ureteral  calculus seen on previous CT not identified on this exam, possibly obscured by bony structures and bowel gas. 2. Faint 5 mm right renal calculus as above. Electronically Signed   By: Sharlet Salina M.D.   On: 11/13/2019 20:50   CT Renal Stone Study  Result Date: 11/13/2019 CLINICAL DATA:  Right flank region pain EXAM: CT ABDOMEN AND PELVIS WITHOUT CONTRAST TECHNIQUE: Multidetector CT imaging of the abdomen and pelvis was performed following the standard protocol without oral or IV contrast. COMPARISON:  August 18, 2015 FINDINGS: Lower chest: There is slight posterior left base atelectasis. Lung bases otherwise are clear. Hepatobiliary: There is hepatic steatosis. No focal liver lesions are appreciable on this noncontrast enhanced study. Gallbladder wall is not appreciably thickened. There is no biliary duct dilatation. Pancreas: There is no pancreatic mass or inflammatory focus. Spleen: No splenic lesions are evident. Adrenals/Urinary Tract: Adrenals bilaterally appear normal. Right kidney is subtly edematous. There is mild hydronephrosis on the right. There is no hydronephrosis on the left. There is no appreciable renal mass on either side. There is a calculus in the lower pole of the right kidney measuring 7 x 5 mm. There is no intrarenal calculus on the left. There is a calculus in the mid right ureter at the S1 level measuring 5 x 5 mm with mild focal ureteral edema in this area. No other ureteral calculi are evident. Urinary bladder is midline with wall thickness within normal limits. Stomach/Bowel: There are sigmoid diverticula without diverticulitis. There is no appreciable bowel wall or mesenteric thickening. Terminal ileum appears normal. No evident bowel obstruction. No free air or portal venous air. Vascular/Lymphatic: There is no abdominal aortic aneurysm. No vascular lesions are evident on this noncontrast enhanced study. There is no evident adenopathy in the abdomen or pelvis. A partially  calcified subcentimeter lymph node in the left mid pelvis is considered nonspecific. Reproductive: Prostate and seminal vesicles are normal in size and contour. No evident pelvic mass. Other: Appendix appears normal. There is no evident abscess or ascites in the abdomen or pelvis. Postoperative change noted in the right inguinal region. No evident bowel containing hernia. Musculoskeletal: No blastic or lytic bone lesions. No intramuscular lesion evident. IMPRESSION: 1. There is a 5 x 5 mm calculus at the S1 level in the right ureter with mild hydronephrosis on the  right. 2.  Nonobstructing 7 x 5 mm calculus lower pole right kidney. 3. No bowel obstruction. There are sigmoid diverticula without diverticulitis. No abscess in the abdomen or pelvis. Appendix appears normal. 4.  Hepatic steatosis. Electronically Signed   By: Bretta Bang III M.D.   On: 11/13/2019 11:28    EKG: Independently reviewed.   Assessment plan  Renal colic Secondary to 5 to 5 mm calculus at the S1 level in the right ureter with mild hydronephrosis on the right Nonobstructive 7-5 millimetric calculus lower pole right kidney Denies any fever or chills vomiting has some nausea Normal white count hemoglobin 12.9 platelets 255 Sodium 139 potassium 3.7 BUN 15 creatinine 1.36 Urinalysis no UTI Plan n.p.o. discussed with urology possible intervention in the morning, no need for antibiotics, pain control, IV fluids  History of gout On allopurinol and colchicine Plan check uric acid level  Hyperlipidemia  Resume fenofibrate  GERD PPI   Assessment/Plan Principal Problem:   Renal colic on right side Active Problems:   Fatty liver   Hypertriglyceridemia   Kidney stone   Gout      DVT prophylaxis: Lovenox Code Status: Full code Family Communication: Wife in the room Disposition Plan: Discharge home Consults called: Yes urology Admission status: Observation   Ramie Erman G Larcenia Holaday MD [facility]  If  7PM-7AM, please contact night-coverage [email or web address]   11/14/2019, 2:03 AM

## 2019-11-14 NOTE — Anesthesia Procedure Notes (Addendum)
Procedure Name: Intubation Date/Time: 11/14/2019 6:23 PM Performed by: Rosanne Gutting, CRNA Pre-anesthesia Checklist: Patient identified, Patient being monitored, Timeout performed, Emergency Drugs available and Suction available Patient Re-evaluated:Patient Re-evaluated prior to induction Oxygen Delivery Method: Circle system utilized Preoxygenation: Pre-oxygenation with 100% oxygen Induction Type: IV induction Ventilation: Mask ventilation without difficulty and Oral airway inserted - appropriate to patient size Laryngoscope Size: McGraph and 4 Grade View: Grade I Tube type: Oral Tube size: 7.5 mm Number of attempts: 1 Airway Equipment and Method: Stylet Placement Confirmation: ETT inserted through vocal cords under direct vision,  positive ETCO2 and breath sounds checked- equal and bilateral Secured at: 23 cm Tube secured with: Tape Dental Injury: Teeth and Oropharynx as per pre-operative assessment

## 2019-11-14 NOTE — Consult Note (Signed)
Urology Consult  Requesting physician: Dr. Nelda Bucks  Reason for consultation: Renal colic  Chief Complaint: Flank pain  History of Present Illness: Donald Estrada is a 39 y.o. male who originally presented to the [facility] ED on 11/11/2018 complaining of severe right flank pain intermittent for 2 weeks.  After he collected a urine specimen he states he passed a stone with resolution of his pain and elected to go home prior to being seen.  No imaging was performed  He had recurrent severe right flank pain that radiated to the right hemiscrotum yesterday morning which was rated as severe (10/10).  No identifiable precipitating, aggravating or alleviating factors.  He had nausea without vomiting.  Denied fever, chills or lower urinary tract symptoms.  A stone protocol CT of the abdomen pelvis was performed which showed a 5 mm right mid ureteral calculus with mild to moderate hydronephrosis/hydroureter and a 5 x 7 mm nonobstructing right lower pole renal calculus.  He received moderate relief with parenteral hydromorphone and ketorolac but was not pain free.  He had persistent worsening pain once he got home not controlled with oral analgesics.  He was directly admitted by the hospitalist for parenteral analgesia.  Since admission his pain was better however he had recurrent pain at 0400 this morning and is uncomfortable.  Past Medical History:  Diagnosis Date  . Allergy    seasonal  . Elevated LFTs   . Gout   . Hypertriglyceridemia   . Prediabetes     Past Surgical History:  Procedure Laterality Date  . INGUINAL HERNIA REPAIR     right    Home Medications:  No outpatient medications have been marked as taking for the 11/13/19 encounter [facility] Encounter).    Allergies: No Known Allergies  Family History  Adopted: Yes  Problem Relation Age of Onset  . Heart disease Maternal Grandmother     Social History:  reports that he has never smoked. His smokeless tobacco use  includes chew and snuff. He reports current alcohol use. He reports that he does not use drugs.  ROS: A complete review of systems was performed.  All systems are negative except for pertinent findings as noted.  Physical Exam:  Vital signs in last 24 hours: Temp:  [98 F (36.7 C)-98.4 F (36.9 C)] 98.4 F (36.9 C) (02/23 0541) Pulse Rate:  [61-79] 61 (02/23 0541) Resp:  [15-22] 16 (02/23 0541) BP: (120-147)/(68-100) 120/73 (02/23 0541) SpO2:  [96 %-98 %] 96 % (02/23 0541) Weight:  [97.1 kg] 97.1 kg (02/22 1758) Constitutional:  Alert and oriented, No acute distress HEENT: [place] AT, moist mucus membranes.  Trachea midline, no masses Cardiovascular: Regular rate and rhythm, no clubbing, cyanosis, or edema. Respiratory: Normal respiratory effort, lungs clear bilaterally GI: Abdomen is soft, nontender, nondistended, no abdominal masses GU: No CVA tenderness Skin: No rashes, bruises or suspicious lesions Lymph: No cervical or inguinal adenopathy Neurologic: Grossly intact, no focal deficits, moving all 4 extremities Psychiatric: Normal mood and affect   Laboratory Data:  Recent Labs    11/13/19 1954  WBC 10.2  HGB 12.9*  HCT 38.7*   Recent Labs    11/13/19 1954  NA 139  K 3.7  CL 105  CO2 26  GLUCOSE 122*  BUN 15  CREATININE 1.36*  CALCIUM 9.2   No results for input(s): LABPT, INR in the last 72 hours. No results for input(s): LABURIN in the last 72 hours. Results for orders placed or performed during the hospital  encounter of 11/13/19  Respiratory Panel by RT PCR (Flu A&B, Covid) - Nasopharyngeal Swab     Status: None   Collection Time: 11/13/19  8:54 PM   Specimen: Nasopharyngeal Swab  Result Value Ref Range Status   SARS Coronavirus 2 by RT PCR NEGATIVE NEGATIVE Final    Comment: (NOTE) SARS-CoV-2 target nucleic acids are NOT DETECTED. The SARS-CoV-2 RNA is generally detectable in upper respiratoy specimens during the acute phase of infection. The  lowest concentration of SARS-CoV-2 viral copies this assay can detect is 131 copies/mL. A negative result does not preclude SARS-Cov-2 infection and should not be used as the sole basis for treatment or other patient management decisions. A negative result may occur with  improper specimen collection/handling, submission of specimen other than nasopharyngeal swab, presence of viral mutation(s) within the areas targeted by this assay, and inadequate number of viral copies (<131 copies/mL). A negative result must be combined with clinical observations, patient history, and epidemiological information. The expected result is Negative. Fact Sheet for Patients:  PinkCheek.be Fact Sheet for Healthcare Providers:  GravelBags.it This test is not yet ap proved or cleared by the [place] FDA and  has been authorized for detection and/or diagnosis of SARS-CoV-2 by FDA under an Emergency Use Authorization (EUA). This EUA will remain  in effect (meaning this test can be used) for the duration of the COVID-19 declaration under Section 564(b)(1) of the Act, 21 U.S.C. section 360bbb-3(b)(1), unless the authorization is terminated or revoked sooner.    Influenza A by PCR NEGATIVE NEGATIVE Final   Influenza B by PCR NEGATIVE NEGATIVE Final    Comment: (NOTE) The Xpert Xpress SARS-CoV-2/FLU/RSV assay is intended as an aid in  the diagnosis of influenza from Nasopharyngeal swab specimens and  should not be used as a sole basis for treatment. Nasal washings and  aspirates are unacceptable for Xpert Xpress SARS-CoV-2/FLU/RSV  testing. Fact Sheet for Patients: PinkCheek.be Fact Sheet for Healthcare Providers: GravelBags.it This test is not yet approved or cleared by the [place] FDA and  has been authorized for detection and/or diagnosis of SARS-CoV-2 by  FDA under an Emergency  Use Authorization (EUA). This EUA will remain  in effect (meaning this test can be used) for the duration of the  Covid-19 declaration under Section 564(b)(1) of the Act, 21  U.S.C. section 360bbb-3(b)(1), unless the authorization is  terminated or revoked. Performed at [facility], [address] [place], [place] [address]      Radiologic Imaging: DG Abd 1 View  Result Date: 11/13/2019 CLINICAL DATA:  Renal colic EXAM: ABDOMEN - 1 VIEW COMPARISON:  11/13/2019 FINDINGS: Two supine frontal views of the abdomen and pelvis are obtained. Faint 5 mm right renal calculus is again identified. The mid right ureteral calculus seen on CT is not well visualized on this exam, likely due to overlying bony structures and bowel gas. No bowel obstruction or ileus. IMPRESSION: 1. The obstructing right ureteral calculus seen on previous CT not identified on this exam, possibly obscured by bony structures and bowel gas. 2. Faint 5 mm right renal calculus as above. Electronically Signed   By: Randa Ngo M.D.   On: 11/13/2019 20:50   CT Renal Stone Study  Result Date: 11/13/2019 CLINICAL DATA:  Right flank region pain EXAM: CT ABDOMEN AND PELVIS WITHOUT CONTRAST TECHNIQUE: Multidetector CT imaging of the abdomen and pelvis was performed following the standard protocol without oral or IV contrast. COMPARISON:  August 18, 2015 FINDINGS: Lower chest:  There is slight posterior left base atelectasis. Lung bases otherwise are clear. Hepatobiliary: There is hepatic steatosis. No focal liver lesions are appreciable on this noncontrast enhanced study. Gallbladder wall is not appreciably thickened. There is no biliary duct dilatation. Pancreas: There is no pancreatic mass or inflammatory focus. Spleen: No splenic lesions are evident. Adrenals/Urinary Tract: Adrenals bilaterally appear normal. Right kidney is subtly edematous. There is mild hydronephrosis on the right. There is no hydronephrosis on the left.  There is no appreciable renal mass on either side. There is a calculus in the lower pole of the right kidney measuring 7 x 5 mm. There is no intrarenal calculus on the left. There is a calculus in the mid right ureter at the S1 level measuring 5 x 5 mm with mild focal ureteral edema in this area. No other ureteral calculi are evident. Urinary bladder is midline with wall thickness within normal limits. Stomach/Bowel: There are sigmoid diverticula without diverticulitis. There is no appreciable bowel wall or mesenteric thickening. Terminal ileum appears normal. No evident bowel obstruction. No free air or portal venous air. Vascular/Lymphatic: There is no abdominal aortic aneurysm. No vascular lesions are evident on this noncontrast enhanced study. There is no evident adenopathy in the abdomen or pelvis. A partially calcified subcentimeter lymph node in the left mid pelvis is considered nonspecific. Reproductive: Prostate and seminal vesicles are normal in size and contour. No evident pelvic mass. Other: Appendix appears normal. There is no evident abscess or ascites in the abdomen or pelvis. Postoperative change noted in the right inguinal region. No evident bowel containing hernia. Musculoskeletal: No blastic or lytic bone lesions. No intramuscular lesion evident. IMPRESSION: 1. There is a 5 x 5 mm calculus at the S1 level in the right ureter with mild hydronephrosis on the right. 2.  Nonobstructing 7 x 5 mm calculus lower pole right kidney. 3. No bowel obstruction. There are sigmoid diverticula without diverticulitis. No abscess in the abdomen or pelvis. Appendix appears normal. 4.  Hepatic steatosis. Electronically Signed   By: Bretta Bang III M.D.   On: 11/13/2019 11:28   CT was personally reviewed   Impression/Assessment:  39 y.o. male with a 5 mm right mid ureteral calculus and renal colic refractory to outpatient management.  He is requiring parenteral analgesics.  I discussed treatment options of  ureteroscopy with stone removal versus shockwave lithotripsy.  The pros and cons of each treatment were discussed.  He was informed that shockwave lithotripsy is not available until 2/25 and he could remain in pain until the stone fragments pass.  He could be added on for ureteroscopy today and even if the stone cannot be removed pain could be resolved with a ureteral stent.  Plan:  He has elected to proceed with ureteroscopy. The indications and nature of the planned procedure were discussed as well as the potential  benefits and expected outcome.  Alternatives have been discussed in detail. The most common complications and side effects were discussed including but not limited to infection/sepsis; blood loss; damage to urethra, bladder, ureter, kidney; need for multiple surgeries; need for prolonged stent placement as well as general anesthesia risks. Although uncommon he was also informed of the possibility that the calculus may not be able to be treated due to inability to obtain access to the upper ureter. In that event he would require stent placement and a follow-up procedure after a period of stent dilation. All of his questions were answered and he desires to proceed.  He  also has a nonobstructing 5 x 7 mm right lower pole renal calculus and will also attempt removal under the same anesthetic.   11/14/2019, 6:58 AM  Irineo Axon, MD

## 2019-11-14 NOTE — Discharge Instructions (Signed)
DISCHARGE INSTRUCTIONS FOR KIDNEY STONE/URETERAL STENT   MEDICATIONS:  1. Resume all your other meds from home.  2.  AZO (over-the-counter) can help with the burning/stinging when you urinate. 3.  You may continue oxycodone or ketorolacfor moderate/severe pain 4.  Tamsulosin will help with stent irritation.  And Rx for oxybutynin was also sent to your pharmacy which can help stent irritation  ACTIVITY:  1. May resume regular activities in 24 hours. 2. No driving while on narcotic pain medications  3. Drink plenty of water  4. Continue to walk at home - you can still get blood clots when you are at home, so keep active, but don't over do it.  5. May return to work/school tomorrow or when you feel ready   BATHING:  1. You can shower. 2. You have a string coming from your urethra: The stent string is attached to your ureteral stent. Do not pull on this.   SIGNS/SYMPTOMS TO CALL:  Please call us if you have a fever greater than 101.5, uncontrolled nausea/vomiting, uncontrolled pain, dizziness, unable to urinate, excessively bloody urine, chest pain, shortness of breath, leg swelling, leg pain, or any other concerns or questions.   Common postoperative symptoms are urinary frequency, urgency, urinating small amounts and bladder spasms.  You can reach [place] at [phone].   FOLLOW-UP:  1. You we will be contacted by our office for a follow-up appointment. 2. You have a string attached to your stent, you may remove it on Friday, 11/17/2019. To do this, pull the string until the stent is completely removed. You may feel an odd sensation in your back.

## 2019-11-14 NOTE — Op Note (Signed)
Preoperative diagnosis:  1.  Right mid ureteral calculus 2.  Right nephrolithiasis  Postoperative diagnosis:  1.  Right distal ureteral calculus 2.  Right nephrolithiasis  Procedure:  1. Cystoscopy 2. Right ureteroscopy and stone removal 3. Ureteroscopic laser lithotripsy 4. Right ureteral stent placement (6FR 26 cm)  5. Right retrograde pyelography with interpretation  Surgeon: Nicki Reaper C. Eyad Rochford, M.D.  Anesthesia: General  Complications: None  Intraoperative findings:  1.  Right retrograde pyelography post procedure showed a filling defect in the right lower pole calyx consistent with his known right lower pole calculus.  No contrast extravasation.  EBL: Minimal  Specimens: 1. Calculus fragments for analysis   Indication: Donald Estrada is a 39 y.o. male with intractable renal colic who failed outpatient management.  He has a 5 mm right mid ureteral calculus and a 5 x 7 mm nonobstructing right lower pole calculus on CT.  After reviewing the management options for treatment, the patient elected to proceed with the above surgical procedure(s). We have discussed the potential benefits and risks of the procedure, side effects of the proposed treatment, the likelihood of the patient achieving the goals of the procedure, and any potential problems that might occur during the procedure or recuperation. Informed consent has been obtained.  Description of procedure:  The patient was taken to the operating room and general anesthesia was induced.  The patient was placed in the dorsal lithotomy position, prepped and draped in the usual sterile fashion, and preoperative antibiotics were administered. A preoperative time-out was performed.   A 21 French cystoscope was lubricated and passed under direct vision.  The urethra was normal in caliber without stricture.  The prostate was nonocclusive.  Panendoscopy was performed and the bladder mucosa showed no erythema, solid or papillary  lesions.  Attention was directed to the right ureteral orifice and a 0.038 Sensor wire was met resistance in the right distal ureter secondary to the calculus which was identified on fluoroscopy.  The cystoscope was removed and a 4.5 French semirigid ureteroscope was passed per urethra.  The guidewire was placed to the ureteroscope and inserted into the distal ureter.  The scope was advanced over the wire and the stone was visualized.  The guidewire was passed beyond the stone under direct vision and advanced into the renal pelvis under fluoroscopic guidance.  The ureteroscope was removed and repassed and inserted into the right distal ureter without difficulty.  A 200 m holmium laser fiber was placed to the ureteroscope and the calculus was dusted at settings of 0.2 J and frequency of 10 hz.  Once dusted down to an approximately 3 mm size the remaining fragment was placed on 1.9 French nitinol basket and removed without difficulty.  The ureteroscope was repassed and no remaining stone fragments were notified.  Retrograde pyelogram was performed through the ureteroscope with findings as described above.  A second guidewire was placed and the semirigid scope was removed.  A single channel digital flexible ureteroscope was placed over the working wire.  The distal ureter was engaged however the flexible scope could not be advanced beyond this point.  Since this was a lower pole nonobstructing stone it was elected not to attempt ureteral dilation as the stone can be treated with ESWL at a later date if he desires treatment.  The flexible ureteroscope was removed.  A 6FR/26 cm Contour ureteral stent was placed over the guidewire under fluoroscopic guidance without difficulty.  Good curls were identified under fluoroscopy.  The patient appeared to  tolerate the procedure well and without complications.  After anesthetic reversal the patient was transported to the PACU in stable condition.   Plan: The stent  was left attached to a tether and will be removed on Friday, 11/17/2019.   John Giovanni, MD

## 2019-11-15 NOTE — Progress Notes (Signed)
Patient arrived on unit from cysto with wife @ bedside. Vital signs stable with some hematuria noted when he voided. Only one tiny clot noted and voiding well. Denied pain. PIV D/C'd in left foreman. Discharge instructions given to patient and his wife Banker). Taken via W/C to the medical mall by staff.

## 2019-11-16 NOTE — Anesthesia Postprocedure Evaluation (Signed)
Anesthesia Post Note  Patient: Trase Bunda  Procedure(s) Performed: CYSTOSCOPY/URETEROSCOPY/HOLMIUM LASER/STENT PLACEMENT (Right Ureter) CYSTOSCOPY WITH RETROGRADE PYELOGRAM (Right Ureter)  Patient location during evaluation: PACU Anesthesia Type: General Level of consciousness: awake and alert and oriented Pain management: pain level controlled Vital Signs Assessment: post-procedure vital signs reviewed and stable Respiratory status: spontaneous breathing Cardiovascular status: blood pressure returned to baseline Anesthetic complications: no     Last Vitals:  Vitals:   11/14/19 2158 11/14/19 2312  BP: 112/81 128/81  Pulse: 75 84  Resp: 16 16  Temp: 36.8 C 36.5 C  SpO2: 94% 97%    Last Pain:  Vitals:   11/14/19 2312  TempSrc: Oral  PainSc:                  Crosby Bevan

## 2019-11-20 ENCOUNTER — Telehealth: Payer: Self-pay | Admitting: Urology

## 2019-11-20 ENCOUNTER — Other Ambulatory Visit: Payer: Self-pay | Admitting: Radiology

## 2019-11-20 DIAGNOSIS — N200 Calculus of kidney: Secondary | ICD-10-CM

## 2019-11-20 NOTE — Telephone Encounter (Signed)
Patient coming in today to talk with Amy

## 2019-11-20 NOTE — Telephone Encounter (Signed)
Message from patient's wife:  "I'm 99.9% sure this other stone that we are going to bust Thursday is on the move.  He's getting pain and is now bleeding again.  Bleeding had cleared by Saturday when I pulled the stent.  Woke up Sunday morning in the wee hours hurting bad.  Now bloody urine again.  Poor guy...  "  Dr. Lonna Cobb told the patient that he will need to be set up for lithotripsy for the other stone.

## 2019-11-21 ENCOUNTER — Other Ambulatory Visit
Admission: RE | Admit: 2019-11-21 | Discharge: 2019-11-21 | Disposition: A | Discharge status: Home / Self Care | Payer: BC Managed Care – PPO | Source: Ambulatory Visit | Attending: Urology | Admitting: Urology

## 2019-11-21 ENCOUNTER — Other Ambulatory Visit: Payer: Self-pay

## 2019-11-21 DIAGNOSIS — Z01812 Encounter for preprocedural laboratory examination: Secondary | ICD-10-CM | POA: Diagnosis not present

## 2019-11-21 DIAGNOSIS — Z20822 Contact with and (suspected) exposure to covid-19: Secondary | ICD-10-CM | POA: Insufficient documentation

## 2019-11-21 LAB — SARS CORONAVIRUS 2 (TAT 6-24 HRS): SARS Coronavirus 2: NEGATIVE

## 2019-11-23 ENCOUNTER — Ambulatory Visit: Payer: BC Managed Care – PPO

## 2019-11-23 ENCOUNTER — Ambulatory Visit
Admission: RE | Admit: 2019-11-23 | Discharge: 2019-11-23 | Disposition: A | Discharge status: Home / Self Care | Payer: BC Managed Care – PPO | Attending: Urology | Admitting: Urology

## 2019-11-23 ENCOUNTER — Encounter
Admission: RE | Disposition: A | Discharge status: Home / Self Care | Payer: Self-pay | Source: Home / Self Care | Attending: Urology

## 2019-11-23 DIAGNOSIS — Z01818 Encounter for other preprocedural examination: Secondary | ICD-10-CM | POA: Diagnosis not present

## 2019-11-23 DIAGNOSIS — N200 Calculus of kidney: Secondary | ICD-10-CM | POA: Diagnosis not present

## 2019-11-23 HISTORY — PX: EXTRACORPOREAL SHOCK WAVE LITHOTRIPSY: SHX1557

## 2019-11-23 SURGERY — LITHOTRIPSY, ESWL
Anesthesia: Moderate Sedation | Laterality: Right

## 2019-11-23 MED ORDER — CIPROFLOXACIN HCL 500 MG PO TABS
ORAL_TABLET | ORAL | Status: AC
  Administered 2019-11-23: 500 mg via ORAL
  Filled 2019-11-23: qty 1

## 2019-11-23 MED ORDER — OXYCODONE-ACETAMINOPHEN 5-325 MG PO TABS: 1.0000 | ORAL_TABLET | Freq: Once | ORAL | Status: AC

## 2019-11-23 MED ORDER — TAMSULOSIN HCL 0.4 MG PO CAPS: 0.4000 mg | ORAL_CAPSULE | Freq: Every day | ORAL | 1 refills | Status: DC

## 2019-11-23 MED ORDER — SODIUM CHLORIDE 0.9 % IV SOLN: INTRAVENOUS | Status: DC

## 2019-11-23 MED ORDER — DIAZEPAM 5 MG PO TABS: 10.0000 mg | ORAL_TABLET | ORAL | Status: AC

## 2019-11-23 MED ORDER — DIPHENHYDRAMINE HCL 25 MG PO CAPS
ORAL_CAPSULE | ORAL | Status: AC
  Administered 2019-11-23: 16:00:00 25 mg via ORAL
  Filled 2019-11-23: qty 1

## 2019-11-23 MED ORDER — ONDANSETRON HCL 4 MG/2ML IJ SOLN: 4.0000 mg | Freq: Once | INTRAMUSCULAR | Status: AC | PRN

## 2019-11-23 MED ORDER — CIPROFLOXACIN HCL 500 MG PO TABS: 500.0000 mg | ORAL_TABLET | ORAL | Status: AC

## 2019-11-23 MED ORDER — OXYCODONE-ACETAMINOPHEN 5-325 MG PO TABS
ORAL_TABLET | ORAL | Status: AC
  Administered 2019-11-23: 1 via ORAL
  Filled 2019-11-23: qty 1

## 2019-11-23 MED ORDER — ONDANSETRON HCL 4 MG/2ML IJ SOLN
INTRAMUSCULAR | Status: AC
  Administered 2019-11-23: 4 mg via INTRAVENOUS
  Filled 2019-11-23: qty 2

## 2019-11-23 MED ORDER — DIPHENHYDRAMINE HCL 25 MG PO CAPS: 25.0000 mg | ORAL_CAPSULE | ORAL | Status: AC

## 2019-11-23 MED ORDER — DIAZEPAM 5 MG PO TABS
ORAL_TABLET | ORAL | Status: AC
  Administered 2019-11-23: 10 mg via ORAL
  Filled 2019-11-23: qty 2

## 2019-11-23 MED ORDER — OXYCODONE-ACETAMINOPHEN 5-325 MG PO TABS: 1.0000 | ORAL_TABLET | ORAL | 0 refills | Status: AC | PRN

## 2019-11-23 NOTE — H&P (Signed)
UROLOGY H&P UPDATE  Agree with prior H&P dated 11/14/2019 by Dr. Lonna Cobb. Underwent R URS/LL/stent with Dr. Lonna Cobb on 2/23 for right 32mm mid ureteral stone, unable to access 58mm right lower pole stone secondary to anatomy and here today for SWL of right lower pole stone.  Cardiac: RRR Lungs: CTA bilaterally  Laterality: RIGHT Procedure: SWL  Informed consent obtained, we specifically discussed the risks of bleeding, infection, post-operative pain, obstructive fragments, possible need for additional procedures.  Sondra Come, MD 11/23/2019

## 2019-11-23 NOTE — Brief Op Note (Addendum)
11/23/2019  5:51 PM  PATIENT:  Donald Estrada  39 y.o. male  PRE-OPERATIVE DIAGNOSIS:  Right 89mm lower pole stone  POST-OPERATIVE DIAGNOSIS:  Same  PROCEDURE:  Procedure(s): EXTRACORPOREAL SHOCK WAVE LITHOTRIPSY (ESWL) (Right)  SURGEON:  Surgeon(s) and Role:    * Sondra Come, MD - Primary  ANESTHESIA: Conscious Sedation  EBL:  None  Drains: None  Specimen: None  Findings:  1. Excellent fragmentation of lower pole stone  DISPO: Flomax, pain meds PRN, RTC 2 weeks KUB  Legrand Rams, MD 11/23/2019

## 2019-11-30 LAB — CALCULI, WITH PHOTOGRAPH (CLINICAL LAB)
Calcium Oxalate Dihydrate: 100 %
Weight Calculi: 6 mg

## 2019-12-05 NOTE — Progress Notes (Signed)
12/07/2019 10:33 AM   Donald Estrada 03/23/1981 [id number]  Referring provider: Susy Frizzle, MD [address] [facility] [place],  [place] [address]  Chief Complaint  Patient presents with  . Nephrolithiasis    HPI: Donald Estrada is 39 y/o caucasian male, who returns today for the evaluation and management of nephrolithiasis. s/p ESWL.  He had a ED visit on 11/13/19 for kidney stones. He under went a Cystoscopy/ Ureteroscopy/Holmium laser/Stent placement  performed on 11/14/2019. Dr. Bernardo Estrada notified pt of another kidney stone removal via phone call. On 11/23/19 ESWL was performed.   Pt is feeling great and denies gross hematuria.   KUB today showed no signs of definite nephrolithiasis.  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   PMH: Past Medical History:  Diagnosis Date  . Allergy    seasonal  . Elevated LFTs   . Gout   . Hypertriglyceridemia   . Prediabetes     Surgical History: Past Surgical History:  Procedure Laterality Date  . CYSTOSCOPY W/ RETROGRADES Right 11/14/2019   Procedure: CYSTOSCOPY WITH RETROGRADE PYELOGRAM;  Surgeon: Donald Sons, MD;  Location: [facility];  Service: Urology;  Laterality: Right;  . CYSTOSCOPY/URETEROSCOPY/HOLMIUM LASER/STENT PLACEMENT Right 11/14/2019   Procedure: CYSTOSCOPY/URETEROSCOPY/HOLMIUM LASER/STENT PLACEMENT;  Surgeon: Donald Sons, MD;  Location: [facility];  Service: Urology;  Laterality: Right;  . EXTRACORPOREAL SHOCK WAVE LITHOTRIPSY Right 11/23/2019   Procedure: EXTRACORPOREAL SHOCK WAVE LITHOTRIPSY (ESWL);  Surgeon: Donald Co, MD;  Location: [facility];  Service: Urology;  Laterality: Right;  . INGUINAL HERNIA REPAIR     right    Home Medications:  Allergies as of 12/07/2019   No Known Allergies     Medication List         Accurate as of December 07, 2019 10:33 AM. If you have any questions, ask your nurse or doctor.        allopurinol 300 MG tablet Commonly known as: ZYLOPRIM TAKE 1 TABLET BY MOUTH EVERY DAY   Colchicine 0.6 MG Caps Take 0.6 mg by mouth daily. What changed:   when to take this  reasons to take this   fenofibrate 160 MG tablet TAKE 1 TABLET BY MOUTH EVERY DAY   ketorolac 10 MG tablet Commonly known as: TORADOL Take 1 tablet (10 mg total) by mouth every 6 (six) hours as needed.   omega-3 acid ethyl esters 1 g capsule Commonly known as: LOVAZA Take 2 g by mouth at bedtime.   omeprazole 20 MG capsule Commonly known as: PRILOSEC Take 20 mg by mouth daily as needed (heartburn and indigestion.).   oxyCODONE-acetaminophen 7.5-325 MG tablet Commonly known as: Percocet Take 1 tablet by mouth every 6 (six) hours as needed.   tamsulosin 0.4 MG Caps capsule Commonly known as: Flomax Take 1 capsule (0.4 mg total) by mouth daily.   testosterone cypionate 200 MG/ML injection Commonly known as: DEPOTESTOSTERONE CYPIONATE INJECT 1 ML INTO THE MUSCLE EVERY 14 DAYS       Allergies: No Known Allergies  Family History: Family History  Adopted: Yes  Problem Relation Age of Onset  . Heart disease Maternal Grandmother     Social History:  reports that he has never smoked. His smokeless tobacco use includes chew and snuff. He reports current alcohol use. He reports that  he does not use drugs.   Physical Exam: BP 120/77   Pulse 80   Ht 5\' 10"  (1.778 m)   Wt 204 lb 4.8 oz (92.7 kg)   BMI 29.31 kg/m   Constitutional:  Well nourished. Alert and oriented, No acute distress. HEENT: [place] AT, moist mucus membranes.  Trachea midline, no masses. Cardiovascular: No clubbing, cyanosis, or edema. Respiratory: Normal respiratory effort, no increased work of breathing. Skin: No rashes, bruises or suspicious lesions. Neurologic: Grossly intact, no focal deficits, moving all 4  extremities. Psychiatric: Normal mood and affect.   Laboratory Data: Lab Results  Component Value Date   WBC 10.2 11/13/2019   HGB 12.9 (L) 11/13/2019   HCT 38.7 (L) 11/13/2019   MCV 94.9 11/13/2019   PLT 255 11/13/2019    Lab Results  Component Value Date   CREATININE 1.36 (H) 11/13/2019    Lab Results  Component Value Date   PSA 0.8 03/09/2019   PSA 0.8 04/28/2018    Pertinent Imaging: CLINICAL DATA:  Right nephrolithiasis.  EXAM: ABDOMEN - 1 VIEW  COMPARISON:  November 23, 2019.  FINDINGS: The bowel gas pattern is normal. No radio-opaque calculi or other significant radiographic abnormality are seen.  IMPRESSION: No definite nephrolithiasis seen currently.   Electronically Signed   By: November 25, 2019 M.D.   On: 12/07/2019 09:45  I have personally reviewed the images and agree with radiologist interpretation and do not see a stone   Assessment & Plan:    1. Nephrolithiasis   Reviewed dietary concerns and recommended pt to drink more water Will f/u with RUS in 1 month with UA to rule out RBC  If both variables negative will offer metabolic workup   Return in about 1 month (around 01/07/2020) for RUS report, UA and office visit .  01/09/2020, PA-C  [facility] [address], [address] [place], [place] [phone]  I, [phone], am acting as a Donald Estrada for Neurosurgeon, PA-C.  I have reviewed the above documentation for accuracy and completeness, and I agree with the above.    [employer], PA-C

## 2019-12-07 ENCOUNTER — Ambulatory Visit
Admission: RE | Admit: 2019-12-07 | Discharge: 2019-12-07 | Disposition: A | Discharge status: Home / Self Care | Payer: BC Managed Care – PPO | Source: Ambulatory Visit | Attending: Urology | Admitting: Urology

## 2019-12-07 ENCOUNTER — Other Ambulatory Visit: Payer: Self-pay | Admitting: Family Medicine

## 2019-12-07 ENCOUNTER — Encounter: Payer: Self-pay | Admitting: Urology

## 2019-12-07 ENCOUNTER — Ambulatory Visit
Admission: RE | Admit: 2019-12-07 | Discharge: 2019-12-07 | Disposition: A | Discharge status: Home / Self Care | Payer: BC Managed Care – PPO | Attending: Urology | Admitting: Urology

## 2019-12-07 ENCOUNTER — Other Ambulatory Visit: Payer: Self-pay

## 2019-12-07 ENCOUNTER — Ambulatory Visit (INDEPENDENT_AMBULATORY_CARE_PROVIDER_SITE_OTHER): Payer: BC Managed Care – PPO | Admitting: Urology

## 2019-12-07 VITALS — BP 120/77 | HR 80 | Ht 70.0 in | Wt 204.3 lb

## 2019-12-07 DIAGNOSIS — N200 Calculus of kidney: Secondary | ICD-10-CM

## 2019-12-08 ENCOUNTER — Telehealth: Payer: Self-pay | Admitting: *Deleted

## 2019-12-08 NOTE — Telephone Encounter (Signed)
-----   Message from Riki Altes, MD sent at 12/07/2019  8:45 PM EDT ----- The previously seen calculus is not identified on today's KUB.  Keep follow-up as scheduled

## 2019-12-11 NOTE — Telephone Encounter (Signed)
Notified patient wife as instructed, patient pleased. Discussed follow-up appointments, patient agrees  

## 2019-12-26 ENCOUNTER — Ambulatory Visit
Admission: RE | Admit: 2019-12-26 | Discharge: 2019-12-26 | Disposition: A | Discharge status: Home / Self Care | Payer: BC Managed Care – PPO | Source: Ambulatory Visit | Attending: Urology | Admitting: Urology

## 2019-12-26 ENCOUNTER — Other Ambulatory Visit: Payer: Self-pay

## 2019-12-26 DIAGNOSIS — N200 Calculus of kidney: Secondary | ICD-10-CM | POA: Diagnosis not present

## 2020-01-02 ENCOUNTER — Other Ambulatory Visit: Payer: Self-pay | Admitting: Urology

## 2020-01-12 ENCOUNTER — Encounter: Payer: Self-pay | Admitting: Urology

## 2020-01-12 ENCOUNTER — Ambulatory Visit: Payer: Self-pay | Admitting: Urology

## 2020-01-12 DIAGNOSIS — G4733 Obstructive sleep apnea (adult) (pediatric): Secondary | ICD-10-CM | POA: Diagnosis not present

## 2020-02-05 ENCOUNTER — Encounter: Payer: Self-pay | Admitting: Radiology

## 2020-02-05 ENCOUNTER — Telehealth: Payer: Self-pay | Admitting: Radiology

## 2020-02-05 NOTE — Telephone Encounter (Signed)
LMOM to return call to reschedule appointment. 

## 2020-02-05 NOTE — Telephone Encounter (Signed)
Appointment made

## 2020-02-05 NOTE — Telephone Encounter (Signed)
-----   Message from Harle Battiest, PA-C sent at 01/31/2020 10:00 AM EDT ----- Please call Mr. Jeffries and have him reschedule his missed appointment to review his RUS and recheck his urine for microscopic blood.

## 2020-02-06 ENCOUNTER — Encounter: Payer: Self-pay | Admitting: Urology

## 2020-02-06 ENCOUNTER — Ambulatory Visit (INDEPENDENT_AMBULATORY_CARE_PROVIDER_SITE_OTHER): Payer: BC Managed Care – PPO | Admitting: Urology

## 2020-02-06 ENCOUNTER — Other Ambulatory Visit: Payer: Self-pay

## 2020-02-06 VITALS — BP 119/85 | HR 87 | Ht 70.0 in | Wt 200.0 lb

## 2020-02-06 DIAGNOSIS — N200 Calculus of kidney: Secondary | ICD-10-CM

## 2020-02-06 NOTE — Progress Notes (Signed)
12/07/2019 2:44 PM   Donald Estrada 08/01/1981 [id number]  Referring provider: Susy Frizzle, MD [address] [facility] [place],  [place] [address]  Chief Complaint  Patient presents with  . Nephrolithiasis    HPI: Donald Estrada is 39 y/o male, who returns today for the evaluation and management of nephrolithiasis.  s/p ESWL.  He had a ED visit on 11/13/19 for kidney stones. He under went a Cystoscopy/ Ureteroscopy/Holmium laser/Stent placement  performed on 11/14/2019. Dr. Bernardo Heater notified pt of another kidney stone removal via phone call. On 11/23/19 ESWL was performed.   KUB today showed no signs of definite nephrolithiasis.    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  RUS December 26, 2019 negative for hydronephrosis.  Today's UA negative for micro heme.   PMH: Past Medical History:  Diagnosis Date  . Allergy    seasonal  . Elevated LFTs   . Gout   . Hypertriglyceridemia   . Prediabetes     Surgical History: Past Surgical History:  Procedure Laterality Date  . CYSTOSCOPY W/ RETROGRADES Right 11/14/2019   Procedure: CYSTOSCOPY WITH RETROGRADE PYELOGRAM;  Surgeon: Abbie Sons, MD;  Location: [facility];  Service: Urology;  Laterality: Right;  . CYSTOSCOPY/URETEROSCOPY/HOLMIUM LASER/STENT PLACEMENT Right 11/14/2019   Procedure: CYSTOSCOPY/URETEROSCOPY/HOLMIUM LASER/STENT PLACEMENT;  Surgeon: Abbie Sons, MD;  Location: [facility];  Service: Urology;  Laterality: Right;  . EXTRACORPOREAL SHOCK WAVE LITHOTRIPSY Right 11/23/2019   Procedure: EXTRACORPOREAL SHOCK WAVE LITHOTRIPSY (ESWL);  Surgeon: Billey Co, MD;  Location: [facility];  Service: Urology;  Laterality: Right;  . INGUINAL HERNIA REPAIR     right    Home Medications:  Allergies as of 02/06/2020   No Known  Allergies     Medication List       Accurate as of Feb 06, 2020 11:59 PM. If you have any questions, ask your nurse or doctor.        allopurinol 300 MG tablet Commonly known as: ZYLOPRIM TAKE 1 TABLET BY MOUTH EVERY DAY   Colchicine 0.6 MG Caps Take 0.6 mg by mouth daily. What changed:   when to take this  reasons to take this   fenofibrate 160 MG tablet TAKE 1 TABLET BY MOUTH EVERY DAY   ketorolac 10 MG tablet Commonly known as: TORADOL Take 1 tablet (10 mg total) by mouth every 6 (six) hours as needed.   omega-3 acid ethyl esters 1 g capsule Commonly known as: LOVAZA Take 2 g by mouth at bedtime.   omeprazole 20 MG capsule Commonly known as: PRILOSEC Take 20 mg by mouth daily as needed (heartburn and indigestion.).   oxyCODONE-acetaminophen 7.5-325 MG tablet Commonly known as: Percocet Take 1 tablet by mouth every 6 (six) hours as needed.   tamsulosin 0.4 MG Caps capsule Commonly known as: Flomax Take 1 capsule (0.4 mg total) by mouth daily.   testosterone cypionate 200 MG/ML injection Commonly known as: DEPOTESTOSTERONE CYPIONATE INJECT 1 ML INTO THE MUSCLE EVERY 14 DAYS       Allergies: No Known Allergies  Family History: Family History  Adopted: Yes  Problem Relation Age of Onset  . Heart disease Maternal Grandmother     Social History:  reports that he has never smoked. His smokeless tobacco use includes chew and snuff. He reports  current alcohol use. He reports that he does not use drugs.   Physical Exam: BP 119/85   Pulse 87   Ht 5\' 10"  (1.778 m)   Wt 200 lb (90.7 kg)   BMI 28.70 kg/m   Constitutional:  Well nourished. Alert and oriented, No acute distress. HEENT: [place] AT, mask in place.  Trachea midline Cardiovascular: No clubbing, cyanosis, or edema. Respiratory: Normal respiratory effort, no increased work of breathing. Neurologic: Grossly intact, no focal deficits, moving all 4 extremities. Psychiatric: Normal mood and  affect.  Laboratory Data: Urinalysis Component     Latest Ref Rng & Units 02/06/2020          Specific Gravity, UA     1.005 - 1.030 >1.030 (H)  pH, UA     5.0 - 7.5 5.0  Color, UA     Yellow Yellow  Appearance Ur     Clear Clear  Leukocytes,UA     Negative Negative  Protein,UA     Negative/Trace Negative  Glucose, UA     Negative Negative  Ketones, UA     Negative Negative  RBC, UA     Negative Trace (A)  Bilirubin, UA     Negative Negative  Urobilinogen, Ur     0.2 - 1.0 mg/dL 0.2  Nitrite, UA     Negative Negative  Microscopic Examination      See below:   Component     Latest Ref Rng & Units 02/06/2020          WBC, UA     0 - 5 /hpf 0-5  RBC     0 - 2 /hpf 0-2  Epithelial Cells (non renal)     0 - 10 /hpf 0-10  Mucus, UA     Not Estab. Present (A)  Bacteria, UA     None seen/Few None seen   Lab Results  Component Value Date   WBC 10.2 11/13/2019   HGB 12.9 (L) 11/13/2019   HCT 38.7 (L) 11/13/2019   MCV 94.9 11/13/2019   PLT 255 11/13/2019    Lab Results  Component Value Date   CREATININE 1.36 (H) 11/13/2019    Lab Results  Component Value Date   PSA 0.8 03/09/2019   PSA 0.8 04/28/2018    Pertinent Imaging: CLINICAL DATA:  Nephrolithiasis  EXAM: RENAL / URINARY TRACT ULTRASOUND COMPLETE  COMPARISON:  CT abdomen and pelvis November 13, 2019  FINDINGS: Right Kidney:  Renal measurements: 12.1 x 4.6 x 5.6 cm = volume: 165.5 mL . Echogenicity and renal cortical thickness within normal limits. No mass, perinephric fluid, or hydronephrosis visualized. No sonographically demonstrable calculus or ureterectasis.  Left Kidney:  Renal measurements: 12.2 x 5.6 x 5.2 cm = volume: 186.4 mL. Echogenicity and renal cortical thickness are within normal limits. No mass, perinephric fluid, or hydronephrosis visualized. No sonographically demonstrable calculus or ureterectasis.  Bladder:  Appears normal for degree of bladder  distention.  Other:  None.  IMPRESSION: Study within normal limits.   Electronically Signed   By: November 15, 2019 III M.D.   On: 12/26/2019 09:09 I have independently reviewed the films and do not appreciate hydronephrosis.   Assessment & Plan:    1. Nephrolithiasis   - s/p ESWL 11/23/2019 - no hydronephrosis on recent RUS - no micro heme on UA -Order 24-hour metabolic work-up  Return for 24 hour metabolic report .  01/23/2020, PA-C  [facility] [address] [place], [place] [phone]    

## 2020-02-07 LAB — URINALYSIS, COMPLETE
Bilirubin, UA: NEGATIVE
Glucose, UA: NEGATIVE
Ketones, UA: NEGATIVE
Leukocytes,UA: NEGATIVE
Nitrite, UA: NEGATIVE
Protein,UA: NEGATIVE
Specific Gravity, UA: >1.03 — ABNORMAL HIGH (ref 1.005–1.030)
Urobilinogen, Ur: 0.2 mg/dL (ref 0.2–1.0)
pH, UA: 5 (ref 5.0–7.5)

## 2020-02-07 LAB — MICROSCOPIC EXAMINATION: Bacteria, UA: NONE SEEN

## 2020-02-26 ENCOUNTER — Other Ambulatory Visit: Payer: BC Managed Care – PPO

## 2020-03-03 ENCOUNTER — Other Ambulatory Visit: Payer: Self-pay | Admitting: Family Medicine

## 2020-03-04 ENCOUNTER — Telehealth: Payer: Self-pay | Admitting: Family Medicine

## 2020-03-04 NOTE — Telephone Encounter (Signed)
Last refilled: 10/31/2019 Last office visit: 03/09/2019

## 2020-03-05 MED ORDER — TESTOSTERONE CYPIONATE 200 MG/ML IM SOLN: INTRAMUSCULAR | 0 refills | Status: DC

## 2020-03-05 NOTE — Telephone Encounter (Signed)
Sorry forgot to attach patient is requesting a refill on his testosterone.

## 2020-03-05 NOTE — Telephone Encounter (Signed)
I couldn't see what medicine he was requesting in your message. I am fine with a temporary supply of any but he needs ov and labs (cbc, cmp, psa, flp, testosterone) Dx- dyslipidemia and hypogonadism

## 2020-03-18 ENCOUNTER — Other Ambulatory Visit: Payer: Self-pay

## 2020-03-18 ENCOUNTER — Other Ambulatory Visit: Payer: BC Managed Care – PPO

## 2020-03-18 DIAGNOSIS — N200 Calculus of kidney: Secondary | ICD-10-CM | POA: Diagnosis not present

## 2020-03-21 ENCOUNTER — Other Ambulatory Visit: Payer: Self-pay | Admitting: Urology

## 2020-03-21 NOTE — Progress Notes (Signed)
12/07/2019 10:19 AM   Donald Estrada Aug 27, 1981 [id number]  Referring provider: Donita Brooks, MD [address] [facility] [address] [place],  [place] [address]  Chief Complaint  Patient presents with  . Nephrolithiasis    HPI: Donald Estrada is 39 y/o male, who returns today for the evaluation and management of nephrolithiasis.  He had a ED visit on 11/13/19 for kidney stones. He under went a Cystoscopy/ Ureteroscopy/Holmium laser/Stent placement  performed on 11/14/2019. Dr. Lonna Cobb notified pt of another kidney stone removal via phone call. On 11/23/19 ESWL was performed.   KUB showed no signs of definite nephrolithiasis.  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  RUS December 26, 2019 negative for hydronephrosis.   He drinks 8-10 bottles of water a day however, he  eats a lot of meat including steak w/o heavy salt intake.  Stone composition is 100% calcium oxalate dihydrate.  He completed a 24-hour metabolic work-up on March 18, 2020 which indicated a low urine volume, hypercalciuria, high urine pH, moderate calcium oxalate stone risk and an extreme calcium phosphate stone risk.   PMH: Past Medical History:  Diagnosis Date  . Allergy    seasonal  . Elevated LFTs   . Gout   . Hypertriglyceridemia   . Prediabetes     Surgical History: Past Surgical History:  Procedure Laterality Date  . CYSTOSCOPY W/ RETROGRADES Right 11/14/2019   Procedure: CYSTOSCOPY WITH RETROGRADE PYELOGRAM;  Surgeon: Riki Altes, MD;  Location: [facility];  Service: Urology;  Laterality: Right;  . CYSTOSCOPY/URETEROSCOPY/HOLMIUM LASER/STENT PLACEMENT Right 11/14/2019   Procedure: CYSTOSCOPY/URETEROSCOPY/HOLMIUM LASER/STENT PLACEMENT;  Surgeon: Riki Altes, MD;  Location: [facility];  Service: Urology;  Laterality: Right;  . EXTRACORPOREAL SHOCK WAVE LITHOTRIPSY Right 11/23/2019   Procedure: EXTRACORPOREAL SHOCK WAVE LITHOTRIPSY  (ESWL);  Surgeon: Sondra Come, MD;  Location: [facility];  Service: Urology;  Laterality: Right;  . INGUINAL HERNIA REPAIR     right    Home Medications:  Allergies as of 03/22/2020   No Known Allergies     Medication List       Accurate as of March 22, 2020 10:19 AM. If you have any questions, ask your nurse or doctor.        STOP taking these medications   oxyCODONE-acetaminophen 7.5-325 MG tablet Commonly known as: Percocet Stopped by: Michiel Cowboy, PA-C     TAKE these medications   allopurinol 300 MG tablet Commonly known as: ZYLOPRIM TAKE 1 TABLET BY MOUTH EVERY DAY   Colchicine 0.6 MG Caps Take 0.6 mg by mouth daily. What changed:   when to take this  reasons to take this   fenofibrate 160 MG tablet TAKE 1 TABLET BY MOUTH EVERY DAY   ketorolac 10 MG tablet Commonly known as: TORADOL Take 1 tablet (10 mg total) by mouth every 6 (six) hours as needed.   omega-3 acid ethyl esters 1 g capsule Commonly known as: LOVAZA Take 2 g by mouth at bedtime.   omeprazole 20 MG capsule Commonly known as: PRILOSEC Take 20 mg by mouth daily as needed (heartburn and indigestion.).   tamsulosin 0.4 MG Caps capsule Commonly known as: Flomax Take 1 capsule (0.4 mg total) by mouth daily.   testosterone cypionate 200 MG/ML injection Commonly known as: DEPOTESTOSTERONE CYPIONATE INJECT 1 ML INTO THE MUSCLE EVERY 14 DAYS       Allergies: No Known Allergies  Family History: Family History  Adopted:  Yes  Problem Relation Age of Onset  . Heart disease Maternal Grandmother     Social History:  reports that he has never smoked. His smokeless tobacco use includes chew and snuff. He reports current alcohol use. He reports that he does not use drugs.   Physical Exam: BP 135/81   Pulse 88   Ht 5\' 10"  (1.778 m)   Wt 206 lb (93.4 kg)   BMI 29.56 kg/m   Constitutional:  Well nourished. Alert and oriented, No acute distress. HEENT: [place] AT, mask in place.  Trachea  midline Cardiovascular: No clubbing, cyanosis, or edema. Respiratory: Normal respiratory effort, no increased work of breathing. Neurologic: Grossly intact, no focal deficits, moving all 4 extremities. Psychiatric: Normal mood and affect.  Laboratory Data: Urinalysis Component     Latest Ref Rng & Units 02/06/2020          Specific Gravity, UA     1.005 - 1.030 >1.030 (H)  pH, UA     5.0 - 7.5 5.0  Color, UA     Yellow Yellow  Appearance Ur     Clear Clear  Leukocytes,UA     Negative Negative  Protein,UA     Negative/Trace Negative  Glucose, UA     Negative Negative  Ketones, UA     Negative Negative  RBC, UA     Negative Trace (A)  Bilirubin, UA     Negative Negative  Urobilinogen, Ur     0.2 - 1.0 mg/dL 0.2  Nitrite, UA     Negative Negative  Microscopic Examination      See below:   Component     Latest Ref Rng & Units 02/06/2020          WBC, UA     0 - 5 /hpf 0-5  RBC     0 - 2 /hpf 0-2  Epithelial Cells (non renal)     0 - 10 /hpf 0-10  Mucus, UA     Not Estab. Present (A)  Bacteria, UA     None seen/Few None seen   Lab Results  Component Value Date   WBC 10.2 11/13/2019   HGB 12.9 (L) 11/13/2019   HCT 38.7 (L) 11/13/2019   MCV 94.9 11/13/2019   PLT 255 11/13/2019    Lab Results  Component Value Date   CREATININE 1.36 (H) 11/13/2019    Lab Results  Component Value Date   PSA 0.8 03/09/2019   PSA 0.8 04/28/2018  I have reviewed the labs.  Pertinent Imaging: CLINICAL DATA:  Nephrolithiasis  EXAM: RENAL / URINARY TRACT ULTRASOUND COMPLETE  COMPARISON:  CT abdomen and pelvis November 13, 2019  FINDINGS: Right Kidney:  Renal measurements: 12.1 x 4.6 x 5.6 cm = volume: 165.5 mL . Echogenicity and renal cortical thickness within normal limits. No mass, perinephric fluid, or hydronephrosis visualized. No sonographically demonstrable calculus or ureterectasis.  Left Kidney:  Renal measurements: 12.2 x 5.6 x 5.2 cm = volume:  186.4 mL. Echogenicity and renal cortical thickness are within normal limits. No mass, perinephric fluid, or hydronephrosis visualized. No sonographically demonstrable calculus or ureterectasis.  Bladder:  Appears normal for degree of bladder distention.  Other:  None.  IMPRESSION: Study within normal limits.   Electronically Signed   By: November 15, 2019 III M.D.   On: 12/26/2019 09:09    Assessment & Plan:    1. Nephrolithiasis   - s/p ESWL 11/23/2019 - no hydronephrosis on recent RUS - no micro heme on  UA  2. Low urine volume Encouraged patient to increase his fluid intake to 2.5 L daily, explained that most of his fluid intake should be water  3. Hypercalciuria Explained the patient that he needed to decrease his sodium intake to 2300-3500 mg/day, calcium [phone] mg/day from food and decrease his protein 0.8 to 1.3 gm/kg/day Will check PTH, Vitamin D level and TSH to rule out hypercalcemia, Vit D excess and hyperthyroidism  4. High urine pH Patient will start adding Litho Lyte packets to his liquids I recommended that he take two packets in the am, afternoon and pm  5. Moderate CaOx stone risk Repeating 24 hour urine in 6 weeks  6. Extreme CaP stone risk Repeating 24 hour urine in 6 weeks    No follow-ups on file.  Michiel Cowboy, PA-C  [facility] [address] [place], [place] [address] [phone]  I spent 30 minutes on the day of the encounter to include pre-visit record review, face-to-face time with the patient, and post-visit ordering of tests.

## 2020-03-22 ENCOUNTER — Other Ambulatory Visit: Payer: Self-pay

## 2020-03-22 ENCOUNTER — Encounter: Payer: Self-pay | Admitting: Urology

## 2020-03-22 ENCOUNTER — Ambulatory Visit (INDEPENDENT_AMBULATORY_CARE_PROVIDER_SITE_OTHER): Payer: BC Managed Care – PPO | Admitting: Urology

## 2020-03-22 VITALS — BP 135/81 | HR 88 | Ht 70.0 in | Wt 206.0 lb

## 2020-03-22 DIAGNOSIS — N200 Calculus of kidney: Secondary | ICD-10-CM | POA: Diagnosis not present

## 2020-03-22 DIAGNOSIS — R82994 Hypercalciuria: Secondary | ICD-10-CM | POA: Diagnosis not present

## 2020-03-22 DIAGNOSIS — R34 Anuria and oliguria: Secondary | ICD-10-CM

## 2020-03-22 NOTE — Patient Instructions (Signed)

## 2020-04-03 LAB — VITAMIN D 1,25 DIHYDROXY
Vitamin D 1, 25 (OH)2 Total: 72 pg/mL — ABNORMAL HIGH
Vitamin D2 1, 25 (OH)2: <10 pg/mL
Vitamin D3 1, 25 (OH)2: 72 pg/mL

## 2020-04-03 LAB — PTH, INTACT AND CALCIUM
Calcium: 10.5 mg/dL — ABNORMAL HIGH (ref 8.7–10.2)
PTH: 15 pg/mL (ref 15–65)

## 2020-04-03 LAB — TSH: TSH: 1.71 u[IU]/mL (ref 0.450–4.500)

## 2020-04-04 ENCOUNTER — Other Ambulatory Visit: Payer: Self-pay | Admitting: Family Medicine

## 2020-04-29 ENCOUNTER — Other Ambulatory Visit: Payer: Self-pay

## 2020-04-29 ENCOUNTER — Encounter: Payer: Self-pay | Admitting: Family Medicine

## 2020-04-29 ENCOUNTER — Ambulatory Visit: Payer: BC Managed Care – PPO | Admitting: Family Medicine

## 2020-04-29 VITALS — BP 110/72 | HR 62 | Temp 98.1°F | Ht 70.0 in | Wt 215.0 lb

## 2020-04-29 DIAGNOSIS — E291 Testicular hypofunction: Secondary | ICD-10-CM

## 2020-04-29 NOTE — Progress Notes (Signed)
Subjective:    Patient ID: Donald Estrada, male    DOB: Oct 28, 1980, 39 y.o.   MRN: [id number]  HPI   Patient is here today for a follow-up of his hypogonadism.  He is due for fasting lab work.  He states that he occasionally misses his testosterone.  He takes 200 mg every 2 to 3 weeks.  Sometimes he will take it every 2 weeks.  Sometimes he will take it every 3 weeks.  He denies any fatigue.  He denies any muscle weakness.  He denies any erectile dysfunction.  He does complain of myalgias.  He states that he aches all over his body.  He states that he is not as physically active anymore.  He is more in a supervisory role and therefore he does not believe that the aches and pains are in keeping with his level of activity.  I question if it may be due to to myalgias from fenofibrate.  He denies any fevers or chills.  Blood pressure today is acceptable.  Patient is due to recheck his cholesterol, his liver function test, his PSA, CBC. Past Medical History:  Diagnosis Date  . Allergy    seasonal  . Elevated LFTs   . Gout   . Hypertriglyceridemia   . Prediabetes    Past Surgical History:  Procedure Laterality Date  . CYSTOSCOPY W/ RETROGRADES Right 11/14/2019   Procedure: CYSTOSCOPY WITH RETROGRADE PYELOGRAM;  Surgeon: Riki Altes, MD;  Location: [facility];  Service: Urology;  Laterality: Right;  . CYSTOSCOPY/URETEROSCOPY/HOLMIUM LASER/STENT PLACEMENT Right 11/14/2019   Procedure: CYSTOSCOPY/URETEROSCOPY/HOLMIUM LASER/STENT PLACEMENT;  Surgeon: Riki Altes, MD;  Location: [facility];  Service: Urology;  Laterality: Right;  . EXTRACORPOREAL SHOCK WAVE LITHOTRIPSY Right 11/23/2019   Procedure: EXTRACORPOREAL SHOCK WAVE LITHOTRIPSY (ESWL);  Surgeon: Sondra Come, MD;  Location: [facility];  Service: Urology;  Laterality: Right;  . INGUINAL HERNIA REPAIR     right   Current Outpatient Medications on File Prior to Visit  Medication Sig Dispense Refill  . allopurinol (ZYLOPRIM) 300 MG  tablet TAKE 1 TABLET BY MOUTH EVERY DAY 90 tablet 2  . Colchicine 0.6 MG CAPS Take 0.6 mg by mouth daily. (Patient taking differently: Take 0.6 mg by mouth daily as needed (gout). ) 30 capsule 1  . fenofibrate 160 MG tablet TAKE 1 TABLET BY MOUTH EVERY DAY 30 tablet 0  . ketorolac (TORADOL) 10 MG tablet Take 1 tablet (10 mg total) by mouth every 6 (six) hours as needed. 20 tablet 0  . omega-3 acid ethyl esters (LOVAZA) 1 g capsule Take 2 g by mouth at bedtime.    Marland Kitchen omeprazole (PRILOSEC) 20 MG capsule Take 20 mg by mouth daily as needed (heartburn and indigestion.).     Marland Kitchen tamsulosin (FLOMAX) 0.4 MG CAPS capsule Take 1 capsule (0.4 mg total) by mouth daily. 30 capsule 1  . testosterone cypionate (DEPOTESTOSTERONE CYPIONATE) 200 MG/ML injection INJECT 1 ML INTO THE MUSCLE EVERY 14 DAYS 2 mL 0   No current facility-administered medications on file prior to visit.   No Known Allergies Social History   Socioeconomic History  . Marital status: Married    Spouse name: Not on file  . Number of children: Not on file  . Years of education: Not on file  . Highest education level: Not on file  Occupational History  . Not on file  Tobacco Use  . Smoking status: Never Smoker  . Smokeless tobacco: Current User    Types:  Chew, Snuff  Substance and Sexual Activity  . Alcohol use: Yes    Comment: rarely drinks  . Drug use: No  . Sexual activity: Yes    Birth control/protection: Surgical  Other Topics Concern  . Not on file  Social History Narrative  . Not on file   Social Determinants of Health   Financial Resource Strain:   . Difficulty of Paying Living Expenses:   Food Insecurity:   . Worried About Programme researcher, broadcasting/film/video in the Last Year:   . Barista in the Last Year:   Transportation Needs:   . Freight forwarder (Medical):   Marland Kitchen Lack of Transportation (Non-Medical):   Physical Activity:   . Days of Exercise per Week:   . Minutes of Exercise per Session:   Stress:   .  Feeling of Stress :   Social Connections:   . Frequency of Communication with Friends and Family:   . Frequency of Social Gatherings with Friends and Family:   . Attends Religious Services:   . Active Member of Clubs or Organizations:   . Attends Banker Meetings:   Marland Kitchen Marital Status:   Intimate Partner Violence:   . Fear of Current or Ex-Partner:   . Emotionally Abused:   Marland Kitchen Physically Abused:   . Sexually Abused:       Review of Systems  All other systems reviewed and are negative.      Objective:   Physical Exam Vitals reviewed.  Constitutional:      General: He is not in acute distress.    Appearance: He is well-developed. He is not diaphoretic.  HENT:     Right Ear: External ear normal.     Left Ear: External ear normal.     Nose: Nose normal.     Mouth/Throat:     Pharynx: No oropharyngeal exudate.  Eyes:     General: No scleral icterus.    Conjunctiva/sclera: Conjunctivae normal.  Neck:     Thyroid: No thyromegaly.     Vascular: No JVD.  Cardiovascular:     Rate and Rhythm: Normal rate and regular rhythm.     Heart sounds: Normal heart sounds. No murmur heard.  No friction rub. No gallop.   Pulmonary:     Effort: Pulmonary effort is normal. No respiratory distress.     Breath sounds: Normal breath sounds. No wheezing or rales.  Chest:     Chest wall: No tenderness.  Abdominal:     General: Bowel sounds are normal. There is no distension.     Palpations: Abdomen is soft. There is no mass.     Tenderness: There is no abdominal tenderness. There is no guarding or rebound.  Musculoskeletal:        General: No tenderness.  Lymphadenopathy:     Cervical: No cervical adenopathy.  Skin:    Findings: No erythema or rash.  Neurological:     Mental Status: He is alert and oriented to person, place, and time.     Cranial Nerves: No cranial nerve deficit.     Motor: No abnormal muscle tone.     Coordination: Coordination normal.     Deep Tendon  Reflexes: Reflexes are normal and symmetric.           Assessment & Plan:  Hypogonadism in male - Plan: CBC with Differential/Platelet, COMPLETE METABOLIC PANEL WITH GFR, Lipid panel, PSA, Testosterone Total,Free,Bio, Males  Check a CBC, CMP, PSA, and testosterone level.  As long as CBC and PSA are within normal limits I will not change his testosterone as he is asymptomatic.  While the patient is here I will check a fasting lipid panel to monitor his LDL and triglycerides and also check a CMP to monitor his liver function test as he does have a history of fatty liver disease.  Consider temporarily holding fenofibrate for myalgias to see if his myalgias improve.

## 2020-04-30 LAB — LIPID PANEL
Cholesterol: 174 mg/dL (ref <200)
HDL: 24 mg/dL — ABNORMAL LOW (ref >=40)
LDL Cholesterol (Calc): 110 mg/dL (calc) — ABNORMAL HIGH
Non-HDL Cholesterol (Calc): 150 mg/dL (calc) — ABNORMAL HIGH (ref <130)
Total CHOL/HDL Ratio: 7.3 (calc) — ABNORMAL HIGH (ref <5.0)
Triglycerides: 299 mg/dL — ABNORMAL HIGH (ref <150)

## 2020-04-30 LAB — CBC WITH DIFFERENTIAL/PLATELET
Absolute Monocytes: 476 cells/uL (ref 200–950)
Basophils Absolute: 49 cells/uL (ref 0–200)
Basophils Relative: 0.8 %
Eosinophils Absolute: 79 cells/uL (ref 15–500)
Eosinophils Relative: 1.3 %
HCT: 47.3 % (ref 38.5–50.0)
Hemoglobin: 16.1 g/dL (ref 13.2–17.1)
Lymphs Abs: 1696 cells/uL (ref 850–3900)
MCH: 32.2 pg (ref 27.0–33.0)
MCHC: 34 g/dL (ref 32.0–36.0)
MCV: 94.6 fL (ref 80.0–100.0)
MPV: 9 fL (ref 7.5–12.5)
Monocytes Relative: 7.8 %
Neutro Abs: 3800 cells/uL (ref 1500–7800)
Neutrophils Relative %: 62.3 %
Platelets: 316 10*3/uL (ref 140–400)
RBC: 5 10*6/uL (ref 4.20–5.80)
RDW: 12.5 % (ref 11.0–15.0)
Total Lymphocyte: 27.8 %
WBC: 6.1 10*3/uL (ref 3.8–10.8)

## 2020-04-30 LAB — COMPLETE METABOLIC PANEL WITH GFR
AG Ratio: 2.1 (calc) (ref 1.0–2.5)
ALT: 42 U/L (ref 9–46)
AST: 26 U/L (ref 10–40)
Albumin: 4.4 g/dL (ref 3.6–5.1)
Alkaline phosphatase (APISO): 39 U/L (ref 36–130)
BUN: 17 mg/dL (ref 7–25)
CO2: 27 mmol/L (ref 20–32)
Calcium: 9.6 mg/dL (ref 8.6–10.3)
Chloride: 105 mmol/L (ref 98–110)
Creat: 1.33 mg/dL (ref 0.60–1.35)
GFR, Est African American: 77 mL/min/{1.73_m2} (ref >=60)
GFR, Est Non African American: 67 mL/min/{1.73_m2} (ref >=60)
Globulin: 2.1 g/dL (calc) (ref 1.9–3.7)
Glucose, Bld: 100 mg/dL — ABNORMAL HIGH (ref 65–99)
Potassium: 4.4 mmol/L (ref 3.5–5.3)
Sodium: 139 mmol/L (ref 135–146)
Total Bilirubin: 0.6 mg/dL (ref 0.2–1.2)
Total Protein: 6.5 g/dL (ref 6.1–8.1)

## 2020-04-30 LAB — TESTOSTERONE TOTAL,FREE,BIO, MALES
Albumin: 4.4 g/dL (ref 3.6–5.1)
Sex Hormone Binding: 26 nmol/L (ref 10–50)
Testosterone, Bioavailable: 150.4 ng/dL (ref >=110.0)
Testosterone, Free: 74.7 pg/mL (ref 46.0–224.0)
Testosterone: 459 ng/dL (ref 250–827)

## 2020-04-30 LAB — PSA: PSA: 0.7 ng/mL (ref <=4.0)

## 2020-05-02 NOTE — Progress Notes (Incomplete)
12/07/2019 10:55 PM   Donald Estrada Nov 03, 1980 [id number]  Referring provider: Donita Brooks, MD [address] [facility] [address] [place],  [place] [address]  No chief complaint on file.   HPI: Donald Estrada is 39 y/o male, who returns today for the evaluation and management of nephrolithiasis.  He had a ED visit on 11/13/19 for kidney stones. He under went a Cystoscopy/ Ureteroscopy/Holmium laser/Stent placement  performed on 11/14/2019. Dr. Lonna Cobb notified pt of another kidney stone removal via phone call. On 11/23/19 ESWL was performed.   KUB showed no signs of definite nephrolithiasis.  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  RUS December 26, 2019 negative for hydronephrosis.   He drinks 8-10 bottles of water a day however, he  eats a lot of meat including steak w/o heavy salt intake.  Stone composition is 100% calcium oxalate dihydrate.  He completed a 24-hour metabolic work-up on March 18, 2020 which indicated a low urine volume, hypercalciuria, high urine pH, moderate calcium oxalate stone risk and an extreme calcium phosphate stone risk.   PMH: Past Medical History:  Diagnosis Date  . Allergy    seasonal  . Elevated LFTs   . Gout   . Hypertriglyceridemia   . Prediabetes     Surgical History: Past Surgical History:  Procedure Laterality Date  . CYSTOSCOPY W/ RETROGRADES Right 11/14/2019   Procedure: CYSTOSCOPY WITH RETROGRADE PYELOGRAM;  Surgeon: Riki Altes, MD;  Location: [facility];  Service: Urology;  Laterality: Right;  . CYSTOSCOPY/URETEROSCOPY/HOLMIUM LASER/STENT PLACEMENT Right 11/14/2019   Procedure: CYSTOSCOPY/URETEROSCOPY/HOLMIUM LASER/STENT PLACEMENT;  Surgeon: Riki Altes, MD;  Location: [facility];  Service: Urology;  Laterality: Right;  . EXTRACORPOREAL SHOCK WAVE LITHOTRIPSY Right 11/23/2019   Procedure: EXTRACORPOREAL SHOCK WAVE LITHOTRIPSY (ESWL);  Surgeon: Sondra Come,  MD;  Location: [facility];  Service: Urology;  Laterality: Right;  . INGUINAL HERNIA REPAIR     right    Home Medications:  Allergies as of 05/03/2020   No Known Allergies     Medication List       Accurate as of May 02, 2020 10:55 PM. If you have any questions, ask your nurse or doctor.        allopurinol 300 MG tablet Commonly known as: ZYLOPRIM TAKE 1 TABLET BY MOUTH EVERY DAY   Colchicine 0.6 MG Caps Take 0.6 mg by mouth daily. What changed:   when to take this  reasons to take this   fenofibrate 160 MG tablet TAKE 1 TABLET BY MOUTH EVERY DAY   ketorolac 10 MG tablet Commonly known as: TORADOL Take 1 tablet (10 mg total) by mouth every 6 (six) hours as needed.   omega-3 acid ethyl esters 1 g capsule Commonly known as: LOVAZA Take 2 g by mouth at bedtime.   omeprazole 20 MG capsule Commonly known as: PRILOSEC Take 20 mg by mouth daily as needed (heartburn and indigestion.).   tamsulosin 0.4 MG Caps capsule Commonly known as: Flomax Take 1 capsule (0.4 mg total) by mouth daily.   testosterone cypionate 200 MG/ML injection Commonly known as: DEPOTESTOSTERONE CYPIONATE INJECT 1 ML INTO THE MUSCLE EVERY 14 DAYS       Allergies: No Known Allergies  Family History: Family History  Adopted: Yes  Problem Relation Age of Onset  . Heart disease Maternal Grandmother     Social History:  reports that he has never smoked. His smokeless tobacco use includes chew and  snuff. He reports current alcohol use. He reports that he does not use drugs.   Physical Exam: There were no vitals taken for this visit.  Constitutional:  Well nourished. Alert and oriented, No acute distress. HEENT: [place] AT, mask in place.  Trachea midline Cardiovascular: No clubbing, cyanosis, or edema. Respiratory: Normal respiratory effort, no increased work of breathing. Neurologic: Grossly intact, no focal deficits, moving all 4 extremities. Psychiatric: Normal mood and  affect.  Laboratory Data: Urinalysis Component     Latest Ref Rng & Units 02/06/2020          Specific Gravity, UA     1.005 - 1.030 >1.030 (H)  pH, UA     5.0 - 7.5 5.0  Color, UA     Yellow Yellow  Appearance Ur     Clear Clear  Leukocytes,UA     Negative Negative  Protein,UA     Negative/Trace Negative  Glucose, UA     Negative Negative  Ketones, UA     Negative Negative  RBC, UA     Negative Trace (A)  Bilirubin, UA     Negative Negative  Urobilinogen, Ur     0.2 - 1.0 mg/dL 0.2  Nitrite, UA     Negative Negative  Microscopic Examination      See below:   Component     Latest Ref Rng & Units 02/06/2020          WBC, UA     0 - 5 /hpf 0-5  RBC     0 - 2 /hpf 0-2  Epithelial Cells (non renal)     0 - 10 /hpf 0-10  Mucus, UA     Not Estab. Present (A)  Bacteria, UA     None seen/Few None seen   Lab Results  Component Value Date   WBC 6.1 04/29/2020   HGB 16.1 04/29/2020   HCT 47.3 04/29/2020   MCV 94.6 04/29/2020   PLT 316 04/29/2020    Lab Results  Component Value Date   CREATININE 1.33 04/29/2020    Lab Results  Component Value Date   PSA 0.7 04/29/2020   PSA 0.8 03/09/2019   PSA 0.8 04/28/2018  I have reviewed the labs.  Pertinent Imaging: CLINICAL DATA:  Nephrolithiasis  EXAM: RENAL / URINARY TRACT ULTRASOUND COMPLETE  COMPARISON:  CT abdomen and pelvis November 13, 2019  FINDINGS: Right Kidney:  Renal measurements: 12.1 x 4.6 x 5.6 cm = volume: 165.5 mL . Echogenicity and renal cortical thickness within normal limits. No mass, perinephric fluid, or hydronephrosis visualized. No sonographically demonstrable calculus or ureterectasis.  Left Kidney:  Renal measurements: 12.2 x 5.6 x 5.2 cm = volume: 186.4 mL. Echogenicity and renal cortical thickness are within normal limits. No mass, perinephric fluid, or hydronephrosis visualized. No sonographically demonstrable calculus or ureterectasis.  Bladder:  Appears  normal for degree of bladder distention.  Other:  None.  IMPRESSION: Study within normal limits.   Electronically Signed   By: Bretta Bang III M.D.   On: 12/26/2019 09:09    Assessment & Plan:    1. Nephrolithiasis   - s/p ESWL 11/23/2019 - no hydronephrosis on recent RUS - no micro heme on UA  2. Low urine volume Encouraged patient to increase his fluid intake to 2.5 L daily, explained that most of his fluid intake should be water  3. Hypercalciuria Explained the patient that he needed to decrease his sodium intake to 2300-3500 mg/day, calcium [phone] mg/day from food  and decrease his protein 0.8 to 1.3 gm/kg/day Will check PTH, Vitamin D level and TSH to rule out hypercalcemia, Vit D excess and hyperthyroidism  4. High urine pH Patient will start adding Litho Lyte packets to his liquids I recommended that he take two packets in the am, afternoon and pm  5. Moderate CaOx stone risk Repeating 24 hour urine in 6 weeks  6. Extreme CaP stone risk Repeating 24 hour urine in 6 weeks    No follow-ups on file.  Michiel Cowboy, PA-C  [facility] [address] [place], [place] [address] [phone]

## 2020-05-03 ENCOUNTER — Other Ambulatory Visit: Payer: Self-pay

## 2020-05-03 ENCOUNTER — Other Ambulatory Visit: Payer: BC Managed Care – PPO

## 2020-05-03 DIAGNOSIS — N200 Calculus of kidney: Secondary | ICD-10-CM

## 2020-05-08 ENCOUNTER — Ambulatory Visit: Payer: BC Managed Care – PPO | Admitting: Family Medicine

## 2020-05-08 ENCOUNTER — Other Ambulatory Visit: Payer: Self-pay | Admitting: Urology

## 2020-05-08 ENCOUNTER — Encounter: Payer: Self-pay | Admitting: Family Medicine

## 2020-05-08 ENCOUNTER — Other Ambulatory Visit: Payer: Self-pay

## 2020-05-08 VITALS — BP 120/82 | HR 76 | Ht 70.0 in | Wt 212.6 lb

## 2020-05-08 DIAGNOSIS — Z9989 Dependence on other enabling machines and devices: Secondary | ICD-10-CM | POA: Diagnosis not present

## 2020-05-08 DIAGNOSIS — G4733 Obstructive sleep apnea (adult) (pediatric): Secondary | ICD-10-CM

## 2020-05-08 NOTE — Patient Instructions (Signed)
Please continue using your CPAP regularly. While your insurance requires that you use CPAP at least 4 hours each night on 70% of the nights, I recommend, that you not skip any nights and use it throughout the night if you can. Getting used to CPAP and staying with the treatment long term does take time and patience and discipline. Untreated obstructive sleep apnea when it is moderate to severe can have an adverse impact on cardiovascular health and raise her risk for heart disease, arrhythmias, hypertension, congestive heart failure, stroke and diabetes. Untreated obstructive sleep apnea causes sleep disruption, nonrestorative sleep, and sleep deprivation. This can have an impact on your day to day functioning and cause daytime sleepiness and impairment of cognitive function, memory loss, mood disturbance, and problems focussing. Using CPAP regularly can improve these symptoms.   Follow up in 1 year   Sleep Apnea Sleep apnea affects breathing during sleep. It causes breathing to stop for a short time or to become shallow. It can also increase the risk of:  Heart attack.  Stroke.  Being very overweight (obese).  Diabetes.  Heart failure.  Irregular heartbeat. The goal of treatment is to help you breathe normally again. What are the causes? There are three kinds of sleep apnea:  Obstructive sleep apnea. This is caused by a blocked or collapsed airway.  Central sleep apnea. This happens when the brain does not send the right signals to the muscles that control breathing.  Mixed sleep apnea. This is a combination of obstructive and central sleep apnea. The most common cause of this condition is a collapsed or blocked airway. This can happen if:  Your throat muscles are too relaxed.  Your tongue and tonsils are too large.  You are overweight.  Your airway is too small. What increases the risk?  Being overweight.  Smoking.  Having a small airway.  Being older.  Being  male.  Drinking alcohol.  Taking medicines to calm yourself (sedatives or tranquilizers).  Having family members with the condition. What are the signs or symptoms?  Trouble staying asleep.  Being sleepy or tired during the day.  Getting angry a lot.  Loud snoring.  Headaches in the morning.  Not being able to focus your mind (concentrate).  Forgetting things.  Less interest in sex.  Mood swings.  Personality changes.  Feelings of sadness (depression).  Waking up a lot during the night to pee (urinate).  Dry mouth.  Sore throat. How is this diagnosed?  Your medical history.  A physical exam.  A test that is done when you are sleeping (sleep study). The test is most often done in a sleep lab but may also be done at home. How is this treated?   Sleeping on your side.  Using a medicine to get rid of mucus in your nose (decongestant).  Avoiding the use of alcohol, medicines to help you relax, or certain pain medicines (narcotics).  Losing weight, if needed.  Changing your diet.  Not smoking.  Using a machine to open your airway while you sleep, such as: ? An oral appliance. This is a mouthpiece that shifts your lower jaw forward. ? A CPAP device. This device blows air through a mask when you breathe out (exhale). ? An EPAP device. This has valves that you put in each nostril. ? A BPAP device. This device blows air through a mask when you breathe in (inhale) and breathe out.  Having surgery if other treatments do not work. It is   important to get treatment for sleep apnea. Without treatment, it can lead to:  High blood pressure.  Coronary artery disease.  In men, not being able to have an erection (impotence).  Reduced thinking ability. Follow these instructions at home: Lifestyle  Make changes that your doctor recommends.  Eat a healthy diet.  Lose weight if needed.  Avoid alcohol, medicines to help you relax, and some pain  medicines.  Do not use any products that contain nicotine or tobacco, such as cigarettes, e-cigarettes, and chewing tobacco. If you need help quitting, ask your doctor. General instructions  Take over-the-counter and prescription medicines only as told by your doctor.  If you were given a machine to use while you sleep, use it only as told by your doctor.  If you are having surgery, make sure to tell your doctor you have sleep apnea. You may need to bring your device with you.  Keep all follow-up visits as told by your doctor. This is important. Contact a doctor if:  The machine that you were given to use during sleep bothers you or does not seem to be working.  You do not get better.  You get worse. Get help right away if:  Your chest hurts.  You have trouble breathing in enough air.  You have an uncomfortable feeling in your back, arms, or stomach.  You have trouble talking.  One side of your body feels weak.  A part of your face is hanging down. These symptoms may be an emergency. Do not wait to see if the symptoms will go away. Get medical help right away. Call your local emergency services (911 in the [place] Do not drive yourself to the hospital. Summary  This condition affects breathing during sleep.  The most common cause is a collapsed or blocked airway.  The goal of treatment is to help you breathe normally while you sleep. This information is not intended to replace advice given to you by your health care provider. Make sure you discuss any questions you have with your health care provider. Document Revised: 06/24/2018 Document Reviewed: 05/03/2018 Elsevier Patient Education  2020 [employer] Inc[employer]  
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
## 2020-05-09 ENCOUNTER — Other Ambulatory Visit: Payer: Self-pay | Admitting: Family Medicine

## 2020-05-23 ENCOUNTER — Ambulatory Visit
Admission: EM | Admit: 2020-05-23 | Discharge: 2020-05-23 | Disposition: A | Discharge status: Home / Self Care | Payer: BC Managed Care – PPO | Attending: Internal Medicine | Admitting: Internal Medicine

## 2020-05-23 ENCOUNTER — Other Ambulatory Visit: Payer: Self-pay

## 2020-05-23 DIAGNOSIS — Z20822 Contact with and (suspected) exposure to covid-19: Secondary | ICD-10-CM

## 2020-05-23 NOTE — ED Triage Notes (Signed)
Pt had family visit 5 days ago.  The visitor has now tested positive for COVID.  Pt denies any symptoms incl cough, SOB, HA, fatigue.  Nurse visit only for screening.

## 2020-05-25 ENCOUNTER — Other Ambulatory Visit: Payer: Self-pay | Admitting: Unknown Physician Specialty

## 2020-05-25 ENCOUNTER — Other Ambulatory Visit: Payer: Self-pay | Admitting: Physician Assistant

## 2020-05-25 DIAGNOSIS — E781 Pure hyperglyceridemia: Secondary | ICD-10-CM

## 2020-05-25 DIAGNOSIS — U071 COVID-19: Secondary | ICD-10-CM

## 2020-05-25 DIAGNOSIS — E663 Overweight: Secondary | ICD-10-CM

## 2020-05-25 LAB — NOVEL CORONAVIRUS, NAA: SARS-CoV-2, NAA: DETECTED — AB

## 2020-05-25 NOTE — Progress Notes (Signed)
I connected by phone with Konrad Saha on 05/25/2020 at 9:21 AM to discuss the potential use of a new treatment for mild to moderate COVID-19 viral infection in non-hospitalized patients.  This patient is a 39 y.o. male that meets the FDA criteria for Emergency Use Authorization of COVID monoclonal antibody casirivimab/imdevimab.  Has a (+) direct SARS-CoV-2 viral test result  Has mild or moderate COVID-19   Is NOT hospitalized due to COVID-19  Is within 10 days of symptom onset  Has at least one of the high risk factor(s) for progression to severe COVID-19 and/or hospitalization as defined in EUA.  Specific high risk criteria : BMI > 25   I have spoken and communicated the following to the patient or parent/caregiver regarding COVID monoclonal antibody treatment:  1. FDA has authorized the emergency use for the treatment of mild to moderate COVID-19 in adults and pediatric patients with positive results of direct SARS-CoV-2 viral testing who are 24 years of age and older weighing at least 40 kg, and who are at high risk for progressing to severe COVID-19 and/or hospitalization.  2. The significant known and potential risks and benefits of COVID monoclonal antibody, and the extent to which such potential risks and benefits are unknown.  3. Information on available alternative treatments and the risks and benefits of those alternatives, including clinical trials.  4. Patients treated with COVID monoclonal antibody should continue to self-isolate and use infection control measures (e.g., wear mask, isolate, social distance, avoid sharing personal items, clean and disinfect "high touch" surfaces, and frequent handwashing) according to CDC guidelines.   5. The patient or parent/caregiver has the option to accept or refuse COVID monoclonal antibody treatment.  After reviewing this information with the patient, The patient agreed to proceed with receiving casirivimab\imdevimab infusion and  will be provided a copy of the Fact sheet prior to receiving the infusion.  Sx onset 9/1. Set up for infusion on 9/5 @ 8:30a. Directions given to [facility]. Pt is aware that insurance will be charged an infusion fee. He has had one vaccine.  Cline Crock 05/25/2020 9:21 AM

## 2020-05-25 NOTE — Progress Notes (Signed)
Note in error.

## 2020-05-26 ENCOUNTER — Ambulatory Visit (HOSPITAL_COMMUNITY)
Admission: RE | Admit: 2020-05-26 | Discharge: 2020-05-26 | Disposition: A | Discharge status: Home / Self Care | Payer: BC Managed Care – PPO | Source: Ambulatory Visit | Attending: Pulmonary Disease | Admitting: Pulmonary Disease

## 2020-05-26 DIAGNOSIS — E781 Pure hyperglyceridemia: Secondary | ICD-10-CM | POA: Insufficient documentation

## 2020-05-26 DIAGNOSIS — U071 COVID-19: Secondary | ICD-10-CM | POA: Insufficient documentation

## 2020-05-26 DIAGNOSIS — E663 Overweight: Secondary | ICD-10-CM | POA: Insufficient documentation

## 2020-05-26 MED ORDER — SODIUM CHLORIDE 0.9 % IV SOLN
1200.0000 mg | Freq: Once | INTRAVENOUS | Status: AC
  Administered 2020-05-26: 1200 mg via INTRAVENOUS
  Filled 2020-05-26: qty 10

## 2020-05-26 MED ORDER — DIPHENHYDRAMINE HCL 50 MG/ML IJ SOLN: 50.0000 mg | Freq: Once | INTRAMUSCULAR | Status: DC | PRN

## 2020-05-26 MED ORDER — METHYLPREDNISOLONE SODIUM SUCC 125 MG IJ SOLR: 125.0000 mg | Freq: Once | INTRAMUSCULAR | Status: DC | PRN

## 2020-05-26 MED ORDER — FAMOTIDINE IN NACL 20-0.9 MG/50ML-% IV SOLN: 20.0000 mg | Freq: Once | INTRAVENOUS | Status: DC | PRN

## 2020-05-26 MED ORDER — EPINEPHRINE 0.3 MG/0.3ML IJ SOAJ: 0.3000 mg | Freq: Once | INTRAMUSCULAR | Status: DC | PRN

## 2020-05-26 MED ORDER — SODIUM CHLORIDE 0.9 % IV SOLN: INTRAVENOUS | Status: DC | PRN

## 2020-05-26 MED ORDER — ALBUTEROL SULFATE HFA 108 (90 BASE) MCG/ACT IN AERS: 2.0000 | INHALATION_SPRAY | Freq: Once | RESPIRATORY_TRACT | Status: DC | PRN

## 2020-05-26 NOTE — Progress Notes (Signed)
  Diagnosis: COVID-19  Physician: Wright, MD  Procedure: [facility] Infusion [facility] Med: casirivimab\imdevimab infusion - Provided patient with casirivimab\imdevimab fact sheet for patients, parents and caregivers prior to infusion.  Complications: No immediate complications noted.  Discharge: Discharged home   Indiah Heyden R Bisma Klett 05/26/2020   

## 2020-05-26 NOTE — Discharge Instructions (Signed)

## 2020-05-27 ENCOUNTER — Encounter: Payer: Self-pay | Admitting: Family Medicine

## 2020-05-28 ENCOUNTER — Other Ambulatory Visit: Payer: Self-pay | Admitting: Family Medicine

## 2020-05-28 MED ORDER — AMOXICILLIN 875 MG PO TABS: 875.0000 mg | ORAL_TABLET | Freq: Two times a day (BID) | ORAL | 0 refills | Status: DC

## 2020-05-29 ENCOUNTER — Encounter: Payer: Self-pay | Admitting: Family Medicine

## 2020-06-05 NOTE — Progress Notes (Signed)
12/07/2019 12:39 PM   Donald Estrada 05/30/1981 [id number]  Referring provider: Donita Brooks, MD [address] [facility] [address] [place],  [place] [address]  Chief Complaint  Patient presents with  . Nephrolithiasis    HPI: Donald Estrada is 39 y/o male to review his repeat 24 hour metabolic work up.    He had a ED visit on 11/13/19 for kidney stones. He under went a Cystoscopy/ Ureteroscopy/Holmium laser/Stent placement  performed on 11/14/2019. Dr. Lonna Cobb notified pt of another kidney stone removal via phone call. On 11/23/19 ESWL was performed. KUB showed no signs of definite nephrolithiasis.  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  RUS December 26, 2019 negative for hydronephrosis.   He drinks 8-10 bottles of water a day however, he  eats a lot of meat including steak w/o heavy salt intake.  Stone composition is 100% calcium oxalate dihydrate.  He completed a 24-hour metabolic work-up on March 18, 2020 which indicated a low urine volume, hypercalciuria, high urine pH, moderate calcium oxalate stone risk and an extreme calcium phosphate stone risk.  His repeated 24 hour metabolic work up on 05/03/2020 which noted an improvement in his urine volume, urine calcium, urine pH and calcium phosphate stone risk.    He is not experiencing any flank pain, gross hematuria or any passage of fragments.  Patient denies any modifying or aggravating factors.  Patient denies any dysuria or suprapubic.  Patient denies any fevers, chills, nausea or vomiting.   He states that he and his wife have been eating a healthier diet.  He did not take the LithoLyte supplements   PMH: Past Medical History:  Diagnosis Date  . Allergy    seasonal  . Elevated LFTs   . Gout   . Hypertriglyceridemia   . Prediabetes     Surgical History: Past Surgical History:  Procedure Laterality Date  . CYSTOSCOPY W/ RETROGRADES Right 11/14/2019    Procedure: CYSTOSCOPY WITH RETROGRADE PYELOGRAM;  Surgeon: Riki Altes, MD;  Location: [facility];  Service: Urology;  Laterality: Right;  . CYSTOSCOPY/URETEROSCOPY/HOLMIUM LASER/STENT PLACEMENT Right 11/14/2019   Procedure: CYSTOSCOPY/URETEROSCOPY/HOLMIUM LASER/STENT PLACEMENT;  Surgeon: Riki Altes, MD;  Location: [facility];  Service: Urology;  Laterality: Right;  . EXTRACORPOREAL SHOCK WAVE LITHOTRIPSY Right 11/23/2019   Procedure: EXTRACORPOREAL SHOCK WAVE LITHOTRIPSY (ESWL);  Surgeon: Sondra Come, MD;  Location: [facility];  Service: Urology;  Laterality: Right;  . INGUINAL HERNIA REPAIR     right    Home Medications:  Allergies as of 06/06/2020   No Known Allergies     Medication List       Accurate as of June 06, 2020 11:59 PM. If you have any questions, ask your nurse or doctor.        allopurinol 300 MG tablet Commonly known as: ZYLOPRIM TAKE 1 TABLET BY MOUTH EVERY DAY   amoxicillin 875 MG tablet Commonly known as: AMOXIL Take 1 tablet (875 mg total) by mouth 2 (two) times daily.   Colchicine 0.6 MG Caps Take 0.6 mg by mouth daily. What changed:   when to take this  reasons to take this   fenofibrate 160 MG tablet TAKE 1 TABLET BY MOUTH EVERY DAY   ketorolac 10 MG tablet Commonly known as: TORADOL Take 1 tablet (10 mg total) by mouth every 6 (six) hours as needed.   omega-3 acid ethyl esters 1 g capsule Commonly known as: LOVAZA Take 2  g by mouth at bedtime.   omeprazole 20 MG capsule Commonly known as: PRILOSEC Take 20 mg by mouth daily as needed (heartburn and indigestion.).   sildenafil 100 MG tablet Commonly known as: VIAGRA Take 1 tablet (100 mg total) by mouth daily as needed for erectile dysfunction. Started by: Michiel Cowboy, PA-C   tamsulosin 0.4 MG Caps capsule Commonly known as: Flomax Take 1 capsule (0.4 mg total) by mouth daily.   testosterone cypionate 200 MG/ML injection Commonly known as: DEPOTESTOSTERONE  CYPIONATE INJECT 1 ML INTO THE MUSCLE EVERY 14 DAYS       Allergies: No Known Allergies  Family History: Family History  Adopted: Yes  Problem Relation Age of Onset  . Heart disease Maternal Grandmother     Social History:  reports that he has never smoked. His smokeless tobacco use includes chew and snuff. He reports current alcohol use. He reports that he does not use drugs.   Physical Exam: BP (!) 135/92   Pulse 85   Wt 200 lb (90.7 kg)   BMI 28.70 kg/m   Constitutional:  Well nourished. Alert and oriented, No acute distress. HEENT: [place] AT, mask in place.  Trachea midline Cardiovascular: No clubbing, cyanosis, or edema. Respiratory: Normal respiratory effort, no increased work of breathing. Neurologic: Grossly intact, no focal deficits, moving all 4 extremities. Psychiatric: Normal mood and affect.  Laboratory Data: Urinalysis Component     Latest Ref Rng & Units 02/06/2020          Specific Gravity, UA     1.005 - 1.030 >1.030 (H)  pH, UA     5.0 - 7.5 5.0  Color, UA     Yellow Yellow  Appearance Ur     Clear Clear  Leukocytes,UA     Negative Negative  Protein,UA     Negative/Trace Negative  Glucose, UA     Negative Negative  Ketones, UA     Negative Negative  RBC, UA     Negative Trace (A)  Bilirubin, UA     Negative Negative  Urobilinogen, Ur     0.2 - 1.0 mg/dL 0.2  Nitrite, UA     Negative Negative  Microscopic Examination      See below:   Component     Latest Ref Rng & Units 02/06/2020          WBC, UA     0 - 5 /hpf 0-5  RBC     0 - 2 /hpf 0-2  Epithelial Cells (non renal)     0 - 10 /hpf 0-10  Mucus, UA     Not Estab. Present (A)  Bacteria, UA     None seen/Few None seen   Lab Results  Component Value Date   WBC 6.1 04/29/2020   HGB 16.1 04/29/2020   HCT 47.3 04/29/2020   MCV 94.6 04/29/2020   PLT 316 04/29/2020    Lab Results  Component Value Date   CREATININE 1.33 04/29/2020    Lab Results  Component Value Date     PSA 0.7 04/29/2020   PSA 0.8 03/09/2019   PSA 0.8 04/28/2018  I have reviewed the labs.  Pertinent Imaging: CLINICAL DATA:  Nephrolithiasis  EXAM: RENAL / URINARY TRACT ULTRASOUND COMPLETE  COMPARISON:  CT abdomen and pelvis November 13, 2019  FINDINGS: Right Kidney:  Renal measurements: 12.1 x 4.6 x 5.6 cm = volume: 165.5 mL . Echogenicity and renal cortical thickness within normal limits. No mass, perinephric fluid, or hydronephrosis  visualized. No sonographically demonstrable calculus or ureterectasis.  Left Kidney:  Renal measurements: 12.2 x 5.6 x 5.2 cm = volume: 186.4 mL. Echogenicity and renal cortical thickness are within normal limits. No mass, perinephric fluid, or hydronephrosis visualized. No sonographically demonstrable calculus or ureterectasis.  Bladder:  Appears normal for degree of bladder distention.  Other:  None.  IMPRESSION: Study within normal limits.   Electronically Signed   By: Bretta Bang III M.D.   On: 12/26/2019 09:09    Assessment & Plan:    1. Nephrolithiasis   - s/p ESWL 11/23/2019 - will RTC in one year for KUB  2. Low urine volume Resolved  3. Hypercalciuria Resolved Will recheck PTH, Vitamin D level  4. High urine pH Resolved   5. Moderate CaOx stone risk Resolved   6. Extreme CaP stone risk Improved   Return in about 1 year (around 06/06/2021) for KUB.  Michiel Cowboy, PA-C  [facility] [address] [place], [place] [address] [phone]   I spent 25 minutes on the day of the encounter to include pre-visit record review, face-to-face time with the patient, and post-visit ordering of tests.

## 2020-06-06 ENCOUNTER — Other Ambulatory Visit: Payer: Self-pay

## 2020-06-06 ENCOUNTER — Ambulatory Visit (INDEPENDENT_AMBULATORY_CARE_PROVIDER_SITE_OTHER): Payer: BC Managed Care – PPO | Admitting: Urology

## 2020-06-06 VITALS — BP 135/92 | HR 85 | Wt 200.0 lb

## 2020-06-06 DIAGNOSIS — R82994 Hypercalciuria: Secondary | ICD-10-CM

## 2020-06-06 DIAGNOSIS — R34 Anuria and oliguria: Secondary | ICD-10-CM

## 2020-06-06 DIAGNOSIS — N200 Calculus of kidney: Secondary | ICD-10-CM

## 2020-06-06 MED ORDER — SILDENAFIL CITRATE 100 MG PO TABS: 100.0000 mg | ORAL_TABLET | Freq: Every day | ORAL | 3 refills | Status: AC | PRN

## 2020-06-13 ENCOUNTER — Encounter: Payer: Self-pay | Admitting: Urology

## 2020-06-18 DIAGNOSIS — G4733 Obstructive sleep apnea (adult) (pediatric): Secondary | ICD-10-CM | POA: Diagnosis not present

## 2020-06-19 ENCOUNTER — Telehealth: Payer: Self-pay | Admitting: Family Medicine

## 2020-06-19 LAB — PTH, INTACT AND CALCIUM
Calcium: 10.4 mg/dL — ABNORMAL HIGH (ref 8.7–10.2)
PTH: 17 pg/mL (ref 15–65)

## 2020-06-19 LAB — VITAMIN D 1,25 DIHYDROXY
Vitamin D 1, 25 (OH)2 Total: 98 pg/mL — ABNORMAL HIGH
Vitamin D2 1, 25 (OH)2: <10 pg/mL
Vitamin D3 1, 25 (OH)2: 98 pg/mL

## 2020-06-19 NOTE — Telephone Encounter (Signed)
LMOM for patient to return call.

## 2020-06-19 NOTE — Telephone Encounter (Signed)
Lets have him continue to abstain from taking calcium and zinc as there is no medical evidence they can treat Covid and have his calcium level and vitamin D level rechecked in 1 month.

## 2020-06-19 NOTE — Telephone Encounter (Signed)
-----   Message from Harle Battiest, PA-C sent at 06/19/2020  8:33 AM EDT ----- Mr. Lemmerman calcium and vitamin D levels are still elevated.  Is he taking any supplements or vitamins?

## 2020-06-19 NOTE — Telephone Encounter (Signed)
Patient stared taking calcium and zinc about a month ago because of covid . He stop taking them on 05/24/2020.

## 2020-06-21 ENCOUNTER — Encounter: Payer: Self-pay | Admitting: Family Medicine

## 2020-06-21 ENCOUNTER — Other Ambulatory Visit: Payer: Self-pay | Admitting: Family Medicine

## 2020-06-21 DIAGNOSIS — R82994 Hypercalciuria: Secondary | ICD-10-CM

## 2020-06-21 NOTE — Telephone Encounter (Signed)
LMOM for patient to call and schedule a lab appointment. Mychart message was sent also.

## 2020-08-05 ENCOUNTER — Telehealth: Payer: Self-pay | Admitting: Family Medicine

## 2020-08-05 NOTE — Telephone Encounter (Signed)
LMOM for patient to return call and set up a lab appointment  For calcium and vitamin D levels.

## 2020-08-05 NOTE — Telephone Encounter (Signed)
-----   Message from Harle Battiest, PA-C sent at 08/05/2020  7:45 AM EST ----- Please let Mr. Chervenak know that we need to recheck his calcium and Vitamin D levels this week.

## 2020-08-09 ENCOUNTER — Other Ambulatory Visit: Payer: Self-pay | Admitting: Family Medicine

## 2020-08-12 NOTE — Telephone Encounter (Signed)
Pt called office and made lab appt for Wednesday

## 2020-08-14 ENCOUNTER — Other Ambulatory Visit: Payer: Self-pay

## 2020-08-14 ENCOUNTER — Other Ambulatory Visit: Payer: BC Managed Care – PPO

## 2020-08-14 DIAGNOSIS — R82994 Hypercalciuria: Secondary | ICD-10-CM

## 2020-08-15 LAB — CALCIUM: Calcium: 10.3 mg/dL — ABNORMAL HIGH (ref 8.7–10.2)

## 2020-08-15 LAB — VITAMIN D 25 HYDROXY (VIT D DEFICIENCY, FRACTURES): Vit D, 25-Hydroxy: 24.9 ng/mL — ABNORMAL LOW (ref 30.0–100.0)

## 2020-08-19 ENCOUNTER — Telehealth: Payer: Self-pay | Admitting: *Deleted

## 2020-08-19 NOTE — Telephone Encounter (Signed)
Sent my chart message , left message on phone

## 2020-08-19 NOTE — Telephone Encounter (Signed)
-----   Message from Harle Battiest, PA-C sent at 08/19/2020 11:07 AM EST ----- Please let Mr. Conde know that his Vitamin D level is now low, but his calcium level is still a little elevated.  I would have him reach out to his PCP for further evaluation.

## 2020-09-17 DIAGNOSIS — G4733 Obstructive sleep apnea (adult) (pediatric): Secondary | ICD-10-CM | POA: Diagnosis not present

## 2020-11-04 ENCOUNTER — Other Ambulatory Visit: Payer: Self-pay | Admitting: Family Medicine

## 2020-12-01 ENCOUNTER — Other Ambulatory Visit: Payer: Self-pay | Admitting: Family Medicine

## 2020-12-24 DIAGNOSIS — G4733 Obstructive sleep apnea (adult) (pediatric): Secondary | ICD-10-CM | POA: Diagnosis not present

## 2021-02-05 ENCOUNTER — Other Ambulatory Visit: Payer: Self-pay | Admitting: Family Medicine

## 2021-04-17 DIAGNOSIS — M79671 Pain in right foot: Secondary | ICD-10-CM | POA: Diagnosis not present

## 2021-04-17 DIAGNOSIS — M7671 Peroneal tendinitis, right leg: Secondary | ICD-10-CM | POA: Diagnosis not present

## 2021-04-17 DIAGNOSIS — M25371 Other instability, right ankle: Secondary | ICD-10-CM | POA: Diagnosis not present

## 2021-04-17 DIAGNOSIS — M25571 Pain in right ankle and joints of right foot: Secondary | ICD-10-CM | POA: Diagnosis not present

## 2021-05-08 ENCOUNTER — Other Ambulatory Visit: Payer: Self-pay | Admitting: Family Medicine

## 2021-05-28 DIAGNOSIS — G4733 Obstructive sleep apnea (adult) (pediatric): Secondary | ICD-10-CM | POA: Diagnosis not present

## 2021-06-05 ENCOUNTER — Encounter: Payer: Self-pay | Admitting: Nurse Practitioner

## 2021-06-05 ENCOUNTER — Ambulatory Visit: Payer: BC Managed Care – PPO | Admitting: Nurse Practitioner

## 2021-06-05 ENCOUNTER — Other Ambulatory Visit: Payer: Self-pay

## 2021-06-05 VITALS — BP 118/78 | HR 74 | Ht 70.0 in | Wt 215.0 lb

## 2021-06-05 DIAGNOSIS — Z125 Encounter for screening for malignant neoplasm of prostate: Secondary | ICD-10-CM | POA: Diagnosis not present

## 2021-06-05 DIAGNOSIS — M1A0790 Idiopathic chronic gout, unspecified ankle and foot, without tophus (tophi): Secondary | ICD-10-CM | POA: Diagnosis not present

## 2021-06-05 DIAGNOSIS — Z87442 Personal history of urinary calculi: Secondary | ICD-10-CM

## 2021-06-05 DIAGNOSIS — E559 Vitamin D deficiency, unspecified: Secondary | ICD-10-CM

## 2021-06-05 DIAGNOSIS — E291 Testicular hypofunction: Secondary | ICD-10-CM | POA: Diagnosis not present

## 2021-06-05 DIAGNOSIS — E781 Pure hyperglyceridemia: Secondary | ICD-10-CM

## 2021-06-05 DIAGNOSIS — Z23 Encounter for immunization: Secondary | ICD-10-CM | POA: Diagnosis not present

## 2021-06-05 DIAGNOSIS — K760 Fatty (change of) liver, not elsewhere classified: Secondary | ICD-10-CM | POA: Diagnosis not present

## 2021-06-05 DIAGNOSIS — Z13220 Encounter for screening for lipoid disorders: Secondary | ICD-10-CM | POA: Diagnosis not present

## 2021-06-05 DIAGNOSIS — Z136 Encounter for screening for cardiovascular disorders: Secondary | ICD-10-CM | POA: Diagnosis not present

## 2021-06-05 MED ORDER — COLCHICINE 0.6 MG PO CAPS: 0.6000 mg | ORAL_CAPSULE | Freq: Every day | ORAL | 1 refills | Status: AC

## 2021-06-05 MED ORDER — ALLOPURINOL 300 MG PO TABS: 300.0000 mg | ORAL_TABLET | Freq: Every day | ORAL | 2 refills | Status: DC

## 2021-06-05 MED ORDER — TAMSULOSIN HCL 0.4 MG PO CAPS: 0.4000 mg | ORAL_CAPSULE | Freq: Every day | ORAL | 1 refills | Status: DC

## 2021-06-05 MED ORDER — FENOFIBRATE 160 MG PO TABS: 160.0000 mg | ORAL_TABLET | Freq: Every day | ORAL | 1 refills | Status: DC

## 2021-06-05 NOTE — Progress Notes (Signed)
Subjective:    Patient ID: Donald Estrada, male    DOB: [birth year], 40 y.o.   MRN: [id number]  HPI: Donald Estrada is a 40 y.o. male presenting for medication refill.  Chief Complaint  Patient presents with   Medication Refill    Discuss medication refill , no new concerns    GOUT Currently taking allopurinol 300 mg daily for gout prevention.  It sounds like he also has frequent kidney stones and they determined they were made of calcium and uric acid.  Reports gout is in his knees. Duration:chronic Severity: No pain currently Quality: No pain currently Swelling: no Redness: no Trauma: no Recent dietary change or indiscretion: no Fevers: no Nausea/vomiting: no Aggravating factors: Unknown Alleviating factors: Allopurinol 300 mg daily, colchicine Status:  stable  Also taking flomax as needed to help pass kideny stones.  Has kidney stones frequently and is able to manage them at home.  Currently taking fenofibrate.  For high triglycerides-he is tolerating this well.  Not taking omeprazole daily, as needed for acid reflux.  Has not taken testosterone recently.  Is interested in checking his testosterone level today.  No Known Allergies  Outpatient Encounter Medications as of 06/05/2021  Medication Sig   omega-3 acid ethyl esters (LOVAZA) 1 g capsule Take 2 g by mouth at bedtime.   omeprazole (PRILOSEC) 20 MG capsule Take 20 mg by mouth daily as needed (heartburn and indigestion.).    sildenafil (VIAGRA) 100 MG tablet Take 1 tablet (100 mg total) by mouth daily as needed for erectile dysfunction.   [DISCONTINUED] allopurinol (ZYLOPRIM) 300 MG tablet TAKE 1 TABLET BY MOUTH EVERY DAY   [DISCONTINUED] fenofibrate 160 MG tablet TAKE 1 TABLET BY MOUTH EVERY DAY   [DISCONTINUED] tamsulosin (FLOMAX) 0.4 MG CAPS capsule Take 1 capsule (0.4 mg total) by mouth daily.   allopurinol (ZYLOPRIM) 300 MG tablet Take 1 tablet (300 mg total) by mouth daily.   Colchicine 0.6 MG CAPS Take  0.6 mg by mouth daily.   fenofibrate 160 MG tablet Take 1 tablet (160 mg total) by mouth daily.   tamsulosin (FLOMAX) 0.4 MG CAPS capsule Take 1 capsule (0.4 mg total) by mouth daily.   testosterone cypionate (DEPOTESTOSTERONE CYPIONATE) 200 MG/ML injection INJECT 1 ML INTO THE MUSCLE EVERY 14 DAYS (Patient not taking: Reported on 06/05/2021)   [DISCONTINUED] amoxicillin (AMOXIL) 875 MG tablet Take 1 tablet (875 mg total) by mouth 2 (two) times daily.   [DISCONTINUED] Colchicine 0.6 MG CAPS Take 0.6 mg by mouth daily. (Patient not taking: Reported on 06/05/2021)   [DISCONTINUED] ketorolac (TORADOL) 10 MG tablet Take 1 tablet (10 mg total) by mouth every 6 (six) hours as needed. (Patient not taking: Reported on 06/05/2021)   No facility-administered encounter medications on file as of 06/05/2021.    Patient Active Problem List   Diagnosis Date Noted   Hypercalcemia 06/10/2021   Vitamin D deficiency 06/10/2021   Gout 11/14/2019   Acute renal failure (HCC)    Ureteral stone with hydronephrosis    Renal colic on right side 11/13/2019   Kidney stone 11/13/2019   Hypogonadism in male 05/06/2018   Fatty liver 12/27/2015   Hypertriglyceridemia 12/27/2015    Past Medical History:  Diagnosis Date   Allergy    seasonal   Elevated LFTs    Gout    Hypertriglyceridemia    Prediabetes     Relevant past medical, surgical, family and social history reviewed and updated as indicated. Interim medical history since our last  visit reviewed.  Review of Systems  Constitutional: Negative.   Eyes: Negative.   Respiratory: Negative.  Negative for cough, shortness of breath and wheezing.   Cardiovascular:  Negative for chest pain, palpitations and leg swelling.  Genitourinary: Negative.  Negative for difficulty urinating, dysuria, flank pain, frequency, hematuria, testicular pain and urgency.  Musculoskeletal: Negative.   Neurological: Negative.   Hematological: Negative.   Psychiatric/Behavioral:  Negative.    Per HPI unless specifically indicated above     Objective:    BP 118/78 (BP Location: Left Arm, Patient Position: Sitting, Cuff Size: Normal)   Pulse 74   Ht 5\' 10"  (1.778 m)   Wt 215 lb (97.5 kg)   SpO2 96%   BMI 30.85 kg/m   Wt Readings from Last 3 Encounters:  06/05/21 215 lb (97.5 kg)  06/06/20 200 lb (90.7 kg)  05/08/20 212 lb 9.6 oz (96.4 kg)    Physical Exam Vitals and nursing note reviewed.  Constitutional:      General: He is not in acute distress.    Appearance: Normal appearance. He is not toxic-appearing.  Eyes:     General: No scleral icterus.    Extraocular Movements: Extraocular movements intact.  Neck:     Vascular: No carotid bruit.  Cardiovascular:     Rate and Rhythm: Normal rate.     Heart sounds: Normal heart sounds. No murmur heard. Pulmonary:     Effort: Pulmonary effort is normal. No respiratory distress.     Breath sounds: Normal breath sounds. No wheezing or rhonchi.  Abdominal:     General: Abdomen is flat. Bowel sounds are normal. There is no distension.  Musculoskeletal:        General: Normal range of motion.     Cervical back: Normal range of motion.     Right lower leg: No edema.     Left lower leg: No edema.  Skin:    General: Skin is warm and dry.     Coloration: Skin is not jaundiced or pale.  Neurological:     General: No focal deficit present.     Mental Status: He is alert and oriented to person, place, and time.     Motor: No weakness.     Gait: Gait normal.  Psychiatric:        Mood and Affect: Mood normal.        Behavior: Behavior normal.        Thought Content: Thought content normal.        Judgment: Judgment normal.      Assessment & Plan:   Problem List Items Addressed This Visit       Digestive   Fatty liver    Chronic.  Discussed low saturated fat/inflammatory diet.  We will check lipids today-patient is fasting, along with liver enzymes.  Goal of physical activity is 30 minutes 5 times  weekly.      Relevant Orders   CBC with Differential/Platelet (Completed)   COMPLETE METABOLIC PANEL WITH GFR (Completed)   Lipid panel (Completed)   PSA (Completed)   PTH, Intact (ICMA) and Ionized Calcium (Completed)   Testosterone Total,Free,Bio, Males (Completed)     Endocrine   Hypogonadism in male    Chronic.  We will check testosterone level today.  Patient to follow-up with PCP regarding possible future testosterone injections.      Relevant Orders   CBC with Differential/Platelet (Completed)   COMPLETE METABOLIC PANEL WITH GFR (Completed)   Lipid panel (Completed)  PSA (Completed)   PTH, Intact (ICMA) and Ionized Calcium (Completed)   Testosterone Total,Free,Bio, Males (Completed)     Other   Vitamin D deficiency    Recheck vitamin D level today.  Treat as indicated.  Follow-up with PCP as scheduled.      Relevant Orders   VITAMIN D 25 Hydroxy (Vit-D Deficiency, Fractures) (Completed)   Hypertriglyceridemia    Chronic.  Tolerating fenofibrate well-we will plan to continue at current dosing.  We will check lipids today-patient is fasting.  Follow-up with PCP as scheduled..      Relevant Medications   fenofibrate 160 MG tablet   Other Relevant Orders   CBC with Differential/Platelet (Completed)   COMPLETE METABOLIC PANEL WITH GFR (Completed)   Lipid panel (Completed)   PSA (Completed)   PTH, Intact (ICMA) and Ionized Calcium (Completed)   Testosterone Total,Free,Bio, Males (Completed)   Hypercalcemia    Noted on recent blood work from urology office-we will plan to recheck calcium level today along with PTH.  Discussed stopping all calcium supplementation.  We will also check vitamin D level today.  Follow-up pending blood work.      Relevant Orders   CBC with Differential/Platelet (Completed)   COMPLETE METABOLIC PANEL WITH GFR (Completed)   Lipid panel (Completed)   PSA (Completed)   PTH, Intact (ICMA) and Ionized Calcium (Completed)   Testosterone  Total,Free,Bio, Males (Completed)   Gout - Primary    Chronic, stable.  We plan to continue allopurinol 300 mg daily for prevention.  Refill colchicine given if an acute flare develops.  Follow-up with PCP as previously scheduled.      Relevant Medications   allopurinol (ZYLOPRIM) 300 MG tablet   Colchicine 0.6 MG CAPS   Other Visit Diagnoses     History of kidney stones       Relevant Medications   tamsulosin (FLOMAX) 0.4 MG CAPS capsule   Screening PSA (prostate specific antigen)       Relevant Orders   CBC with Differential/Platelet (Completed)   COMPLETE METABOLIC PANEL WITH GFR (Completed)   Lipid panel (Completed)   PSA (Completed)   PTH, Intact (ICMA) and Ionized Calcium (Completed)   Testosterone Total,Free,Bio, Males (Completed)   Need for influenza vaccination       Relevant Orders   Flu Vaccine QUAD 6+ mos PF IM (Fluarix Quad PF) (Completed)   Need for diphtheria-tetanus-pertussis (Tdap) vaccine       Relevant Orders   DTaP vaccine less than 7yo IM (Completed)        Follow up plan: No follow-ups on file.

## 2021-06-06 LAB — CBC WITH DIFFERENTIAL/PLATELET
Absolute Monocytes: 389 cells/uL (ref 200–950)
Basophils Absolute: 58 cells/uL (ref 0–200)
Basophils Relative: 1 %
Eosinophils Absolute: 41 cells/uL (ref 15–500)
Eosinophils Relative: 0.7 %
HCT: 44 % (ref 38.5–50.0)
Hemoglobin: 15.1 g/dL (ref 13.2–17.1)
Lymphs Abs: 1665 cells/uL (ref 850–3900)
MCH: 32.2 pg (ref 27.0–33.0)
MCHC: 34.3 g/dL (ref 32.0–36.0)
MCV: 93.8 fL (ref 80.0–100.0)
MPV: 9.7 fL (ref 7.5–12.5)
Monocytes Relative: 6.7 %
Neutro Abs: 3648 cells/uL (ref 1500–7800)
Neutrophils Relative %: 62.9 %
Platelets: 347 10*3/uL (ref 140–400)
RBC: 4.69 10*6/uL (ref 4.20–5.80)
RDW: 12.2 % (ref 11.0–15.0)
Total Lymphocyte: 28.7 %
WBC: 5.8 10*3/uL (ref 3.8–10.8)

## 2021-06-06 LAB — TESTOSTERONE TOTAL,FREE,BIO, MALES
Albumin: 4.8 g/dL (ref 3.6–5.1)
Sex Hormone Binding: 34 nmol/L (ref 10–50)
Testosterone, Bioavailable: 103.2 ng/dL — ABNORMAL LOW (ref 110.0–575.0)
Testosterone, Free: 47.2 pg/mL (ref 46.0–224.0)
Testosterone: 379 ng/dL (ref 250–827)

## 2021-06-06 LAB — PTH, INTACT (ICMA) AND IONIZED CALCIUM
Calcium, Ion: 5.41 mg/dL (ref 4.8–5.6)
Calcium: 10.3 mg/dL (ref 8.6–10.3)
PTH: 19 pg/mL (ref 16–77)

## 2021-06-06 LAB — COMPLETE METABOLIC PANEL WITH GFR
AG Ratio: 2.2 (calc) (ref 1.0–2.5)
ALT: 80 U/L — ABNORMAL HIGH (ref 9–46)
AST: 43 U/L — ABNORMAL HIGH (ref 10–40)
Albumin: 4.8 g/dL (ref 3.6–5.1)
Alkaline phosphatase (APISO): 50 U/L (ref 36–130)
BUN: 18 mg/dL (ref 7–25)
CO2: 26 mmol/L (ref 20–32)
Calcium: 10.3 mg/dL (ref 8.6–10.3)
Chloride: 105 mmol/L (ref 98–110)
Creat: 1.17 mg/dL (ref 0.60–1.29)
Globulin: 2.2 g/dL (calc) (ref 1.9–3.7)
Glucose, Bld: 112 mg/dL — ABNORMAL HIGH (ref 65–99)
Potassium: 4.4 mmol/L (ref 3.5–5.3)
Sodium: 139 mmol/L (ref 135–146)
Total Bilirubin: 0.4 mg/dL (ref 0.2–1.2)
Total Protein: 7 g/dL (ref 6.1–8.1)
eGFR: 81 mL/min/{1.73_m2} (ref >=60)

## 2021-06-06 LAB — LIPID PANEL
Cholesterol: 208 mg/dL — ABNORMAL HIGH (ref <200)
HDL: 28 mg/dL — ABNORMAL LOW (ref >=40)
LDL Cholesterol (Calc): 136 mg/dL (calc) — ABNORMAL HIGH
Non-HDL Cholesterol (Calc): 180 mg/dL (calc) — ABNORMAL HIGH (ref <130)
Total CHOL/HDL Ratio: 7.4 (calc) — ABNORMAL HIGH (ref <5.0)
Triglycerides: 311 mg/dL — ABNORMAL HIGH (ref <150)

## 2021-06-06 LAB — VITAMIN D 25 HYDROXY (VIT D DEFICIENCY, FRACTURES): Vit D, 25-Hydroxy: 21 ng/mL — ABNORMAL LOW (ref 30–100)

## 2021-06-06 LAB — PSA: PSA: 0.46 ng/mL (ref <=4.00)

## 2021-06-10 DIAGNOSIS — E559 Vitamin D deficiency, unspecified: Secondary | ICD-10-CM | POA: Insufficient documentation

## 2021-06-10 NOTE — Assessment & Plan Note (Signed)
Chronic.  Discussed low saturated fat/inflammatory diet.  We will check lipids today-patient is fasting, along with liver enzymes.  Goal of physical activity is 30 minutes 5 times weekly.

## 2021-06-10 NOTE — Assessment & Plan Note (Signed)
Recheck vitamin D level today.  Treat as indicated.  Follow-up with PCP as scheduled.

## 2021-06-10 NOTE — Assessment & Plan Note (Signed)
Chronic, stable.  We plan to continue allopurinol 300 mg daily for prevention.  Refill colchicine given if an acute flare develops.  Follow-up with PCP as previously scheduled.

## 2021-06-10 NOTE — Assessment & Plan Note (Signed)
Chronic.  We will check testosterone level today.  Patient to follow-up with PCP regarding possible future testosterone injections.

## 2021-06-10 NOTE — Assessment & Plan Note (Signed)
Noted on recent blood work from urology office-we will plan to recheck calcium level today along with PTH.  Discussed stopping all calcium supplementation.  We will also check vitamin D level today.  Follow-up pending blood work.

## 2021-06-10 NOTE — Assessment & Plan Note (Addendum)
Chronic.  Tolerating fenofibrate well-we will plan to continue at current dosing.  We will check lipids today-patient is fasting.  Follow-up with PCP as scheduled.Donald Estrada

## 2021-06-11 ENCOUNTER — Encounter: Payer: Self-pay | Admitting: Family Medicine

## 2021-06-11 NOTE — Addendum Note (Signed)
Addended by: Phillips Odor on: 06/11/2021 05:45 PM   Modules accepted: Orders

## 2021-06-11 NOTE — Progress Notes (Signed)
06/12/21 9:19 AM   Donald Estrada 05-Mar-1981 [id number]  Referring provider:  Susy Frizzle, MD [address] [place],  [place] [address]  Chief Complaint  Patient presents with   Nephrolithiasis    Urological history  Nephrolithiasis  - s/p Cystoscopy/ Ureteroscopy/Holmium laser/Stent placement  performed on 11/14/2019 - s/p ESWL 11/23/2019  - Stone composition: 100% calcium oxalate dihydrate   2. Low urine volume  - 50-NLZJ metabolic wok-up on 67/34/1937 - resolved   3. Hypercalcuria  -PTH 19 on 06/05/2021  - Vitamin D level was 21 on 06/05/2021   4. High urine pH  Resolved   5. Moderate Caox stone risk  As above   HPI: Donald Estrada is a 40 y.o.male who presents today for follow-up with KUB.   KUB today with 2 mm right lower pole stone, retrospectively.   He reports that he had what felt like a stone passing last year. He has no other issues with urination.   Patient denies any modifying or aggravating factors.  Patient denies any gross hematuria, dysuria or suprapubic/flank pain.  Patient denies any fevers, chills, nausea or vomiting.     PMH: Past Medical History:  Diagnosis Date   Allergy    seasonal   Elevated LFTs    Gout    Hypertriglyceridemia    Prediabetes     Surgical History: Past Surgical History:  Procedure Laterality Date   CYSTOSCOPY W/ RETROGRADES Right 11/14/2019   Procedure: CYSTOSCOPY WITH RETROGRADE PYELOGRAM;  Surgeon: Abbie Sons, MD;  Location: [facility];  Service: Urology;  Laterality: Right;   CYSTOSCOPY/URETEROSCOPY/HOLMIUM LASER/STENT PLACEMENT Right 11/14/2019   Procedure: CYSTOSCOPY/URETEROSCOPY/HOLMIUM LASER/STENT PLACEMENT;  Surgeon: Abbie Sons, MD;  Location: [facility];  Service: Urology;  Laterality: Right;   EXTRACORPOREAL SHOCK WAVE LITHOTRIPSY Right 11/23/2019   Procedure: EXTRACORPOREAL SHOCK WAVE LITHOTRIPSY (ESWL);  Surgeon: Billey Co, MD;  Location: [facility];  Service: Urology;  Laterality:  Right;   INGUINAL HERNIA REPAIR     right    Home Medications:  Allergies as of 06/12/2021   No Known Allergies      Medication List        Accurate as of June 12, 2021  9:19 AM. If you have any questions, ask your nurse or doctor.          allopurinol 300 MG tablet Commonly known as: ZYLOPRIM Take 1 tablet (300 mg total) by mouth daily.   Colchicine 0.6 MG Caps Take 0.6 mg by mouth daily.   fenofibrate 160 MG tablet Take 1 tablet (160 mg total) by mouth daily.   omega-3 acid ethyl esters 1 g capsule Commonly known as: LOVAZA Take 2 g by mouth at bedtime.   omeprazole 20 MG capsule Commonly known as: PRILOSEC Take 20 mg by mouth daily as needed (heartburn and indigestion.).   sildenafil 100 MG tablet Commonly known as: VIAGRA Take 1 tablet (100 mg total) by mouth daily as needed for erectile dysfunction.   tamsulosin 0.4 MG Caps capsule Commonly known as: Flomax Take 1 capsule (0.4 mg total) by mouth daily.   testosterone cypionate 200 MG/ML injection Commonly known as: DEPOTESTOSTERONE CYPIONATE INJECT 1 ML INTO THE MUSCLE EVERY 14 DAYS        Allergies:  No Known Allergies  Family History: Family History  Adopted: Yes  Problem Relation Age of Onset   Heart disease Maternal Grandmother     Social History:  reports that he has never smoked. His smokeless tobacco  use includes chew and snuff. He reports current alcohol use. He reports that he does not use drugs.   Physical Exam: BP 120/80   Pulse 65   Ht 5' 10"  (1.778 m)   Wt 215 lb (97.5 kg)   BMI 30.85 kg/m   Constitutional:  Well nourished. Alert and oriented, No acute distress. HEENT: [place] AT, mask in place.  Trachea midline Cardiovascular: No clubbing, cyanosis, or edema. Respiratory: Normal respiratory effort, no increased work of breathing. Neurologic: Grossly intact, no focal deficits, moving all 4 extremities. Psychiatric: Normal mood and affect.   Laboratory Data: Lab  Results  Component Value Date   CREATININE 1.17 06/05/2021   Component     Latest Ref Rng & Units 06/05/2021        10:05 AM  Testosterone     250 - 827 ng/dL 379  Albumin MSPROF     3.6 - 5.1 g/dL 4.8  Sex Horm Binding Glob, Serum     10 - 50 nmol/L 34  Testosterone Free     46.0 - 224.0 pg/mL 47.2  Testosterone, Bioavailable     110.0 - 575.0 ng/dL 103.2 (L)    Component     Latest Ref Rng & Units 06/05/2021        10:05 AM  Glucose     65 - 99 mg/dL 112 (H)  BUN     7 - 25 mg/dL 18  Creatinine     0.60 - 1.29 mg/dL 1.17  eGFR     > OR = 60 mL/min/1.50m 81  BUN/Creatinine Ratio     6 - 22 (calc) NOT APPLICABLE  Sodium     1967- 146 mmol/L 139  Potassium     3.5 - 5.3 mmol/L 4.4  Chloride     98 - 110 mmol/L 105  CO2     20 - 32 mmol/L 26  Calcium     8.6 - 10.3 mg/dL 10.3  Total Protein     6.1 - 8.1 g/dL 7.0  Albumin MSPROF     3.6 - 5.1 g/dL 4.8  Globulin     1.9 - 3.7 g/dL (calc) 2.2  AG Ratio     1.0 - 2.5 (calc) 2.2  Total Bilirubin     0.2 - 1.2 mg/dL 0.4  Alkaline phosphatase (APISO)     36 - 130 U/L 50  AST     10 - 40 U/L 43 (H)  ALT     9 - 46 U/L 80 (H)   Component     Latest Ref Rng & Units 06/05/2021  Cholesterol     <200 mg/dL 208 (H)  HDL Cholesterol     > OR = 40 mg/dL 28 (L)  Triglycerides     <150 mg/dL 311 (H)  LDL Cholesterol (Calc)     mg/dL (calc) 136 (H)  Total CHOL/HDL Ratio     <5.0 (calc) 7.4 (H)  Non-HDL Cholesterol (Calc)     <130 mg/dL (calc) 180 (H)   Component     Latest Ref Rng & Units 06/05/2021        10:05 AM  PTH, Intact     16 - 77 pg/mL 19  Calcium     8.6 - 10.3 mg/dL 10.3  Calcium Ionized     4.8 - 5.6 mg/dL 5.41   Component PSA  Latest Ref Rng & Units < OR = 4.00 ng/mL  04/28/2018 0.8  03/09/2019 0.8  04/29/2020 0.7  06/05/2021  0.46   Component Vitamin D, 25-Hydroxy  Latest Ref Rng & Units 30 - 100 ng/mL  08/14/2020 24.9 (L)  06/05/2021 21 (L)  I have reviewed the labs.    Pertinent  Imaging: CLINICAL DATA:  History of nephrolithiasis   EXAM: ABDOMEN - 1 VIEW   COMPARISON:  X-ray abdomen dated December 07, 2019   FINDINGS: Nonobstructive bowel gas pattern. Small calcification measuring approximately 2 mm projecting over the lower pole the right renal shadow. No radiopaque calculi seen over the expected course of the ureters. No acute osseous abnormality. Visualized lung bases are clear.   IMPRESSION: Probable nephrolithiasis of the lower pole of the right kidney.     Electronically Signed   By: Yetta Glassman M.D.   On: 06/13/2021 08:33 I have independently reviewed the films.  See HPI.    Assessment & Plan:    RIght nephrolithiasis  - S/p ESWL 0304/2021 - KUB with nephrolithiasis in the right lower pole 2 mm in size - no intervention warranted   Hypercalciuria  - Resolved - PTH 19  - Vitamin D 21    Follow-up as needed  [place] [address], [place], [place] [address] [phone]  I have reviewed the above documentation for accuracy and completeness, and I agree with the above.    Zara Council, PA-C

## 2021-06-12 ENCOUNTER — Encounter: Payer: Self-pay | Admitting: Urology

## 2021-06-12 ENCOUNTER — Ambulatory Visit
Admission: RE | Admit: 2021-06-12 | Discharge: 2021-06-12 | Disposition: A | Discharge status: Home / Self Care | Payer: BC Managed Care – PPO | Source: Ambulatory Visit | Attending: Urology | Admitting: Urology

## 2021-06-12 ENCOUNTER — Other Ambulatory Visit: Payer: Self-pay

## 2021-06-12 ENCOUNTER — Ambulatory Visit: Payer: BC Managed Care – PPO | Admitting: Urology

## 2021-06-12 VITALS — BP 120/80 | HR 65 | Ht 70.0 in | Wt 215.0 lb

## 2021-06-12 DIAGNOSIS — N200 Calculus of kidney: Secondary | ICD-10-CM | POA: Diagnosis not present

## 2021-06-12 DIAGNOSIS — N2889 Other specified disorders of kidney and ureter: Secondary | ICD-10-CM | POA: Diagnosis not present

## 2021-06-12 DIAGNOSIS — Z87442 Personal history of urinary calculi: Secondary | ICD-10-CM | POA: Diagnosis not present

## 2021-06-27 DIAGNOSIS — G4733 Obstructive sleep apnea (adult) (pediatric): Secondary | ICD-10-CM | POA: Diagnosis not present

## 2021-06-28 ENCOUNTER — Other Ambulatory Visit: Payer: Self-pay | Admitting: Nurse Practitioner

## 2021-06-28 DIAGNOSIS — Z87442 Personal history of urinary calculi: Secondary | ICD-10-CM

## 2021-07-10 ENCOUNTER — Other Ambulatory Visit: Payer: Self-pay

## 2021-07-10 ENCOUNTER — Ambulatory Visit (INDEPENDENT_AMBULATORY_CARE_PROVIDER_SITE_OTHER): Payer: BC Managed Care – PPO | Admitting: Family Medicine

## 2021-07-10 ENCOUNTER — Encounter: Payer: Self-pay | Admitting: Family Medicine

## 2021-07-10 VITALS — BP 130/72 | HR 80 | Temp 97.8°F | Resp 16 | Ht 70.0 in | Wt 218.0 lb

## 2021-07-10 DIAGNOSIS — E291 Testicular hypofunction: Secondary | ICD-10-CM | POA: Diagnosis not present

## 2021-07-10 DIAGNOSIS — Z0000 Encounter for general adult medical examination without abnormal findings: Secondary | ICD-10-CM

## 2021-07-10 DIAGNOSIS — E559 Vitamin D deficiency, unspecified: Secondary | ICD-10-CM | POA: Diagnosis not present

## 2021-07-10 DIAGNOSIS — K760 Fatty (change of) liver, not elsewhere classified: Secondary | ICD-10-CM

## 2021-07-10 DIAGNOSIS — E781 Pure hyperglyceridemia: Secondary | ICD-10-CM

## 2021-07-10 MED ORDER — ALPRAZOLAM 0.5 MG PO TABS: 0.5000 mg | ORAL_TABLET | Freq: Three times a day (TID) | ORAL | 0 refills | Status: AC | PRN

## 2021-07-10 MED ORDER — ICOSAPENT ETHYL 1 G PO CAPS: 2.0000 g | ORAL_CAPSULE | Freq: Two times a day (BID) | ORAL | 11 refills | Status: DC

## 2021-07-10 NOTE — Progress Notes (Signed)
Subjective:    Patient ID: Donald Estrada, male    DOB: 02-02-1981, 40 y.o.   MRN: [id number]  HPI   Patient is here today for a CPE.  No visits with results within 1 Month(s) from this visit.  Latest known visit with results is:  Office Visit on 06/05/2021  Component Date Value Ref Range Status  . WBC 06/05/2021 5.8  3.8 - 10.8 Thousand/uL Final  . RBC 06/05/2021 4.69  4.20 - 5.80 Million/uL Final  . Hemoglobin 06/05/2021 15.1  13.2 - 17.1 g/dL Final  . HCT 06/05/2021 44.0  38.5 - 50.0 % Final  . MCV 06/05/2021 93.8  80.0 - 100.0 fL Final  . MCH 06/05/2021 32.2  27.0 - 33.0 pg Final  . MCHC 06/05/2021 34.3  32.0 - 36.0 g/dL Final  . RDW 06/05/2021 12.2  11.0 - 15.0 % Final  . Platelets 06/05/2021 347  140 - 400 Thousand/uL Final  . MPV 06/05/2021 9.7  7.5 - 12.5 fL Final  . Neutro Abs 06/05/2021 3,648  1,500 - 7,800 cells/uL Final  . Lymphs Abs 06/05/2021 1,665  850 - 3,900 cells/uL Final  . Absolute Monocytes 06/05/2021 389  200 - 950 cells/uL Final  . Eosinophils Absolute 06/05/2021 41  15 - 500 cells/uL Final  . Basophils Absolute 06/05/2021 58  0 - 200 cells/uL Final  . Neutrophils Relative % 06/05/2021 62.9  % Final  . Total Lymphocyte 06/05/2021 28.7  % Final  . Monocytes Relative 06/05/2021 6.7  % Final  . Eosinophils Relative 06/05/2021 0.7  % Final  . Basophils Relative 06/05/2021 1.0  % Final  . Glucose, Bld 06/05/2021 112 (A)  65 - 99 mg/dL Final   Comment: .            Fasting reference interval . For someone without known diabetes, a glucose value between 100 and 125 mg/dL is consistent with prediabetes and should be confirmed with a follow-up test. .   . BUN 06/05/2021 18  7 - 25 mg/dL Final  . Creat 06/05/2021 1.17  0.60 - 1.29 mg/dL Final  . eGFR 06/05/2021 81  > OR = 60 mL/min/1.67m Final   Comment: The eGFR is based on the CKD-EPI 2021 equation. To calculate  the new eGFR from a previous Creatinine or Cystatin C result, go to  https://www.kidney.org/professionals/ kdoqi/gfr%5Fcalculator   . BUN/Creatinine Ratio 045/03/8882NOT APPLICABLE  6 - 22 (calc) Final  . Sodium 06/05/2021 139  135 - 146 mmol/L Final  . Potassium 06/05/2021 4.4  3.5 - 5.3 mmol/L Final  . Chloride 06/05/2021 105  98 - 110 mmol/L Final  . CO2 06/05/2021 26  20 - 32 mmol/L Final  . Calcium 06/05/2021 10.3  8.6 - 10.3 mg/dL Final  . Total Protein 06/05/2021 7.0  6.1 - 8.1 g/dL Final  . Albumin 06/05/2021 4.8  3.6 - 5.1 g/dL Final  . Globulin 06/05/2021 2.2  1.9 - 3.7 g/dL (calc) Final  . AG Ratio 06/05/2021 2.2  1.0 - 2.5 (calc) Final  . Total Bilirubin 06/05/2021 0.4  0.2 - 1.2 mg/dL Final  . Alkaline phosphatase (APISO) 06/05/2021 50  36 - 130 U/L Final  . AST 06/05/2021 43 (A)  10 - 40 U/L Final  . ALT 06/05/2021 80 (A)  9 - 46 U/L Final  . Cholesterol 06/05/2021 208 (A)  <200 mg/dL Final  . HDL 06/05/2021 28 (A)  > OR = 40 mg/dL Final  . Triglycerides 06/05/2021 311 (A)  <150 mg/dL Final  Comment: . If a non-fasting specimen was collected, consider repeat triglyceride testing on a fasting specimen if clinically indicated.  Yates Decamp et al. J. of Clin. Lipidol. 5183;3:582-518. .   . LDL Cholesterol (Calc) 06/05/2021 136 (A)  mg/dL (calc) Final   Comment: Reference range: <100 . Desirable range <100 mg/dL for primary prevention;   <70 mg/dL for patients with CHD or diabetic patients  with > or = 2 CHD risk factors. Marland Kitchen LDL-C is now calculated using the Martin-Hopkins  calculation, which is a validated novel method providing  better accuracy than the Friedewald equation in the  estimation of LDL-C.  Cresenciano Genre et al. Annamaria Helling. 9842;103(12): 2061-2068  (http://education.[facility].com/faq/FAQ164)   . Total CHOL/HDL Ratio 06/05/2021 7.4 (A)  <5.0 (calc) Final  . Non-HDL Cholesterol (Calc) 06/05/2021 180 (A)  <130 mg/dL (calc) Final   Comment: For patients with diabetes plus 1 major ASCVD risk  factor, treating to a non-HDL-C goal  of <100 mg/dL  (LDL-C of <70 mg/dL) is considered a therapeutic  option.   Marland Kitchen PSA 06/05/2021 0.46  < OR = 4.00 ng/mL Final   Comment: The total PSA value from this assay system is  standardized against the WHO standard. The test  result will be approximately 20% lower when compared  to the equimolar-standardized total PSA (Beckman  Coulter). Comparison of serial PSA results should be  interpreted with this fact in mind. . This test was performed using the Siemens  chemiluminescent method. Values obtained from  different assay methods cannot be used interchangeably. PSA levels, regardless of value, should not be interpreted as absolute evidence of the presence or absence of disease.   Marland Kitchen PTH 06/05/2021 19  16 - 77 pg/mL Final   Comment: . Interpretive Guide    Intact PTH           Calcium ------------------    ----------           ------- Normal Parathyroid    Normal               Normal Hypoparathyroidism    Low or Low Normal    Low Hyperparathyroidism    Primary            Normal or High       High    Secondary          High                 Normal or Low    Tertiary           High                 High Non-Parathyroid    Hypercalcemia      Low or Low Normal    High .   . Calcium 06/05/2021 10.3  8.6 - 10.3 mg/dL Final  . Calcium, Ion 06/05/2021 5.41  4.8 - 5.6 mg/dL Final  . Testosterone 06/05/2021 379  250 - 827 ng/dL Final  . Albumin 06/05/2021 4.8  3.6 - 5.1 g/dL Final  . Sex Hormone Binding 06/05/2021 34  10 - 50 nmol/L Final  . Testosterone, Free 06/05/2021 47.2  46.0 - 224.0 pg/mL Final  . Testosterone, Bioavailable 06/05/2021 103.2 (A)  110.0 - 575.0 ng/dL Final  . Vit D, 25-Hydroxy 06/05/2021 21 (A)  30 - 100 ng/mL Final   Comment: Vitamin D Status         25-OH Vitamin D: . Deficiency:                    <  20 ng/mL Insufficiency:             20 - 29 ng/mL Optimal:                 > or = 30 ng/mL . For 25-OH Vitamin D testing on patients on  D2-supplementation and  patients for whom quantitation  of D2 and D3 fractions is required, the QuestAssureD(TM) 25-OH VIT D, (D2,D3), LC/MS/MS is recommended: order  code [phone] (patients >60yr). See Note 1 . Note 1 . For additional information, please refer to  http://education.[facility].com/faq/FAQ199  (This link is being provided for informational/ educational purposes only.)    Patient's labs continue to concern me.  Despite taking fenofibrate and fish oil 2000 mg twice a day, the patient's triglycerides remain over 300.  His HDL remains low at 28.  He has now developed prediabetes with a blood sugar of 112 and he still has active hepatitis with elevated liver function test due to fatty liver disease.  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  He admits that he can eat better however he is not morbidly obese.  Although he has not yet had a heart attack, I do believe he has metabolic syndrome and is at high risk for future coronary artery disease Past Medical History:  Diagnosis Date  . Allergy    seasonal  . Elevated LFTs   . Gout   . Hypertriglyceridemia   . Prediabetes    Past Surgical History:  Procedure Laterality Date  . CYSTOSCOPY W/ RETROGRADES Right 11/14/2019   Procedure: CYSTOSCOPY WITH RETROGRADE PYELOGRAM;  Surgeon: SAbbie Sons MD;  Location: [facility];  Service: Urology;  Laterality: Right;  . CYSTOSCOPY/URETEROSCOPY/HOLMIUM LASER/STENT PLACEMENT Right 11/14/2019   Procedure: CYSTOSCOPY/URETEROSCOPY/HOLMIUM LASER/STENT PLACEMENT;  Surgeon: SAbbie Sons MD;  Location: [facility];  Service: Urology;  Laterality: Right;  . EXTRACORPOREAL SHOCK WAVE LITHOTRIPSY Right 11/23/2019   Procedure: EXTRACORPOREAL SHOCK WAVE LITHOTRIPSY (ESWL);  Surgeon: SBilley Co MD;  Location: [facility];  Service: Urology;  Laterality: Right;  . INGUINAL HERNIA REPAIR     right   Current Outpatient Medications on File Prior to Visit   Medication Sig Dispense Refill  . allopurinol (ZYLOPRIM) 300 MG tablet Take 1 tablet (300 mg total) by mouth daily. 90 tablet 2  . Colchicine 0.6 MG CAPS Take 0.6 mg by mouth daily. 30 capsule 1  . fenofibrate 160 MG tablet Take 1 tablet (160 mg total) by mouth daily. 90 tablet 1  . omega-3 acid ethyl esters (LOVAZA) 1 g capsule Take 2 g by mouth at bedtime.    .Marland Kitchenomeprazole (PRILOSEC) 20 MG capsule Take 20 mg by mouth daily as needed (heartburn and indigestion.).     .Marland Kitchensildenafil (VIAGRA) 100 MG tablet Take 1 tablet (100 mg total) by mouth daily as needed for erectile dysfunction. 30 tablet 3  . tamsulosin (FLOMAX) 0.4 MG CAPS capsule TAKE 1 CAPSULE BY MOUTH EVERY DAY 30 capsule 1  . testosterone cypionate (DEPOTESTOSTERONE CYPIONATE) 200 MG/ML injection INJECT 1 ML INTO THE MUSCLE EVERY 14 DAYS 2 mL 0   No current facility-administered medications on file prior to visit.   No Known Allergies Social History   Socioeconomic History  . Marital status: Married    Spouse name: Not on file  . Number of children: Not on file  . Years of education: Not on file  . Highest education level: Not on  file  Occupational History  . Not on file  Tobacco Use  . Smoking status: Never  . Smokeless tobacco: Current    Types: Chew, Snuff  Substance and Sexual Activity  . Alcohol use: Yes    Comment: rarely drinks  . Drug use: No  . Sexual activity: Yes    Birth control/protection: Surgical  Other Topics Concern  . Not on file  Social History Narrative  . Not on file   Social Determinants of Health   Financial Resource Strain: Not on file  Food Insecurity: Not on file  Transportation Needs: Not on file  Physical Activity: Not on file  Stress: Not on file  Social Connections: Not on file  Intimate Partner Violence: Not on file      Review of Systems  All other systems reviewed and are negative.     Objective:   Physical Exam Vitals reviewed.  Constitutional:      General: He is  not in acute distress.    Appearance: Normal appearance. He is well-developed and normal weight. He is not ill-appearing, toxic-appearing or diaphoretic.  HENT:     Head: Normocephalic and atraumatic.     Right Ear: Tympanic membrane, ear canal and external ear normal.     Left Ear: Tympanic membrane, ear canal and external ear normal.     Nose: Nose normal. No congestion or rhinorrhea.     Mouth/Throat:     Mouth: Mucous membranes are moist.     Pharynx: No oropharyngeal exudate or posterior oropharyngeal erythema.  Eyes:     General: No scleral icterus.       Right eye: No discharge.        Left eye: No discharge.     Conjunctiva/sclera: Conjunctivae normal.  Neck:     Thyroid: No thyromegaly.     Vascular: No carotid bruit or JVD.  Cardiovascular:     Rate and Rhythm: Normal rate and regular rhythm.     Pulses: Normal pulses.     Heart sounds: Normal heart sounds. No murmur heard.   No friction rub. No gallop.  Pulmonary:     Effort: Pulmonary effort is normal. No respiratory distress.     Breath sounds: Normal breath sounds. No stridor. No wheezing or rales.  Chest:     Chest wall: No tenderness.  Abdominal:     General: Bowel sounds are normal. There is no distension.     Palpations: Abdomen is soft. There is no mass.     Tenderness: There is no abdominal tenderness. There is no guarding or rebound.  Musculoskeletal:        General: No swelling or tenderness.     Cervical back: Normal range of motion and neck supple. No tenderness.     Right lower leg: No edema.     Left lower leg: No edema.  Lymphadenopathy:     Cervical: No cervical adenopathy.  Skin:    Coloration: Skin is not jaundiced.     Findings: No bruising, erythema, lesion or rash.  Neurological:     General: No focal deficit present.     Mental Status: He is alert and oriented to person, place, and time. Mental status is at baseline.     Cranial Nerves: No cranial nerve deficit.     Sensory: No sensory  deficit.     Motor: No weakness or abnormal muscle tone.     Coordination: Coordination normal.     Gait: Gait normal.  Deep Tendon Reflexes: Reflexes are normal and symmetric. Reflexes normal.  Psychiatric:        Mood and Affect: Mood normal.        Behavior: Behavior normal.        Thought Content: Thought content normal.        Judgment: Judgment normal.          Assessment & Plan:  Hypogonadism in male  Vitamin D deficiency  Fatty liver  Hypertriglyceridemia  General medical exam Patient has elected against testosterone replacement.  Continue fenofibrate.  Discontinue fish oil and switch to Vascepa 2 g twice daily given the cardiovascular benefit.  This patient is at high risk for heart disease in the future given his prediabetes/metabolic syndrome/dyslipidemia/hypertriglyceridemia.  I am try to be proactive against that by getting his triglycerides down and reduce his cardiovascular risk with the Vascepa.  Also encouraged a low saturated fat diet.  Patient is also under situational stress at work.  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  He is woefully understaffed.  He is having anxiety almost on a daily basis and having trouble sleeping at night.  We discussed trying Lexapro versus Xanax on an as-needed basis.  He would like to use Xanax sparingly rather than take Lexapro on a daily basis.

## 2021-07-11 ENCOUNTER — Telehealth: Payer: Self-pay | Admitting: *Deleted

## 2021-07-11 NOTE — Telephone Encounter (Signed)
[id number]. VASCEPA CAP 1GM is approved through 07/11/2022

## 2021-07-11 NOTE — Telephone Encounter (Signed)
Received request from pharmacy for PA on Vascepa.  PA submitted.   Dx: E78.1- hypertriglyceridemia.   [facility] is reviewing your PA request. Typically an electronic response will be received within 24-72 hours.

## 2021-07-15 ENCOUNTER — Encounter: Payer: Self-pay | Admitting: Family Medicine

## 2021-07-15 NOTE — Telephone Encounter (Signed)
Call placed to pharmacy. Advised that PA was approved on 07/11/2021.  Co-pay $81 per 30 days.   Call placed to patient and patient made aware.

## 2021-07-28 DIAGNOSIS — G4733 Obstructive sleep apnea (adult) (pediatric): Secondary | ICD-10-CM | POA: Diagnosis not present

## 2021-08-02 ENCOUNTER — Other Ambulatory Visit: Payer: Self-pay | Admitting: Family Medicine

## 2021-08-02 DIAGNOSIS — Z87442 Personal history of urinary calculi: Secondary | ICD-10-CM

## 2021-08-05 IMAGING — CR DG ABDOMEN 1V
1 series · 2 of 2 positions shown · non-contrast
Comparison: Abdominal radiograph 11/13/2019, CT renal colic
11/13/2019

CLINICAL DATA: Preop for lithotripsy, known right kidney stones.

EXAM:
ABDOMEN - 1 VIEW

[Series 1: dg abd 1 view · 0.14mm/px · 2 of 2 slices shown]
[im 1/2]
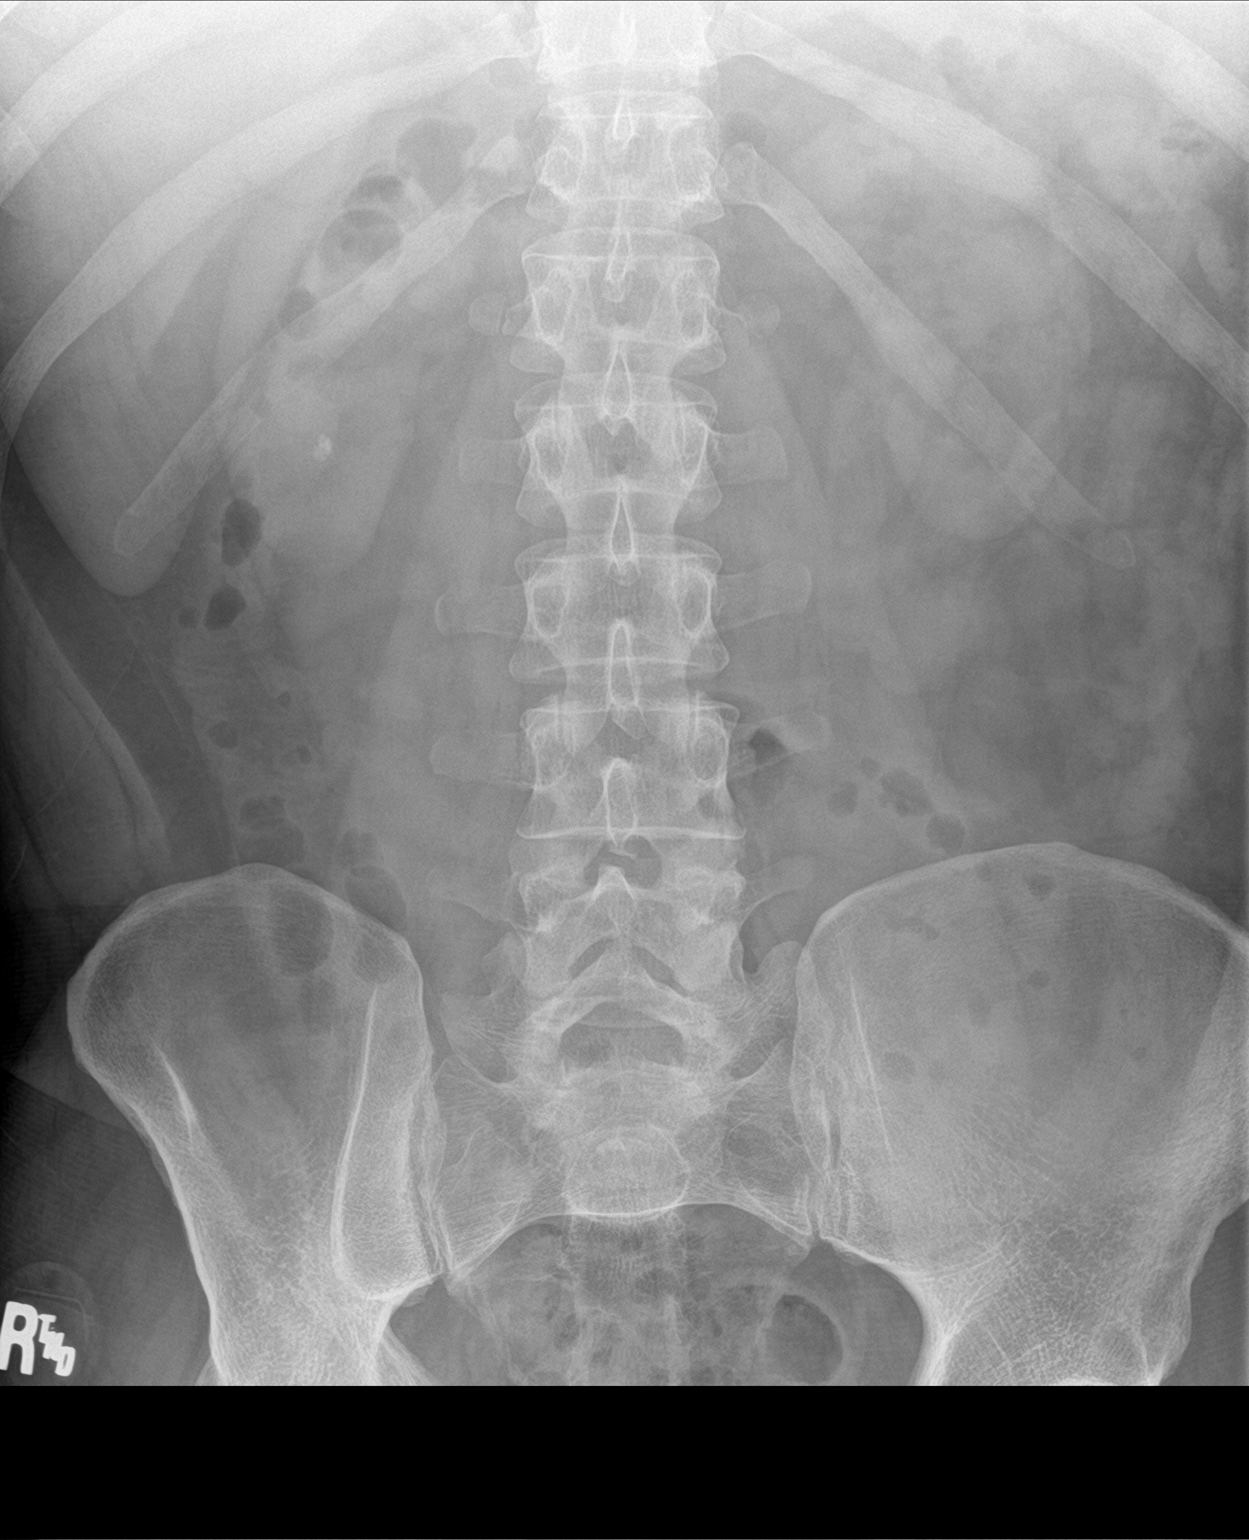
[im 2/2]
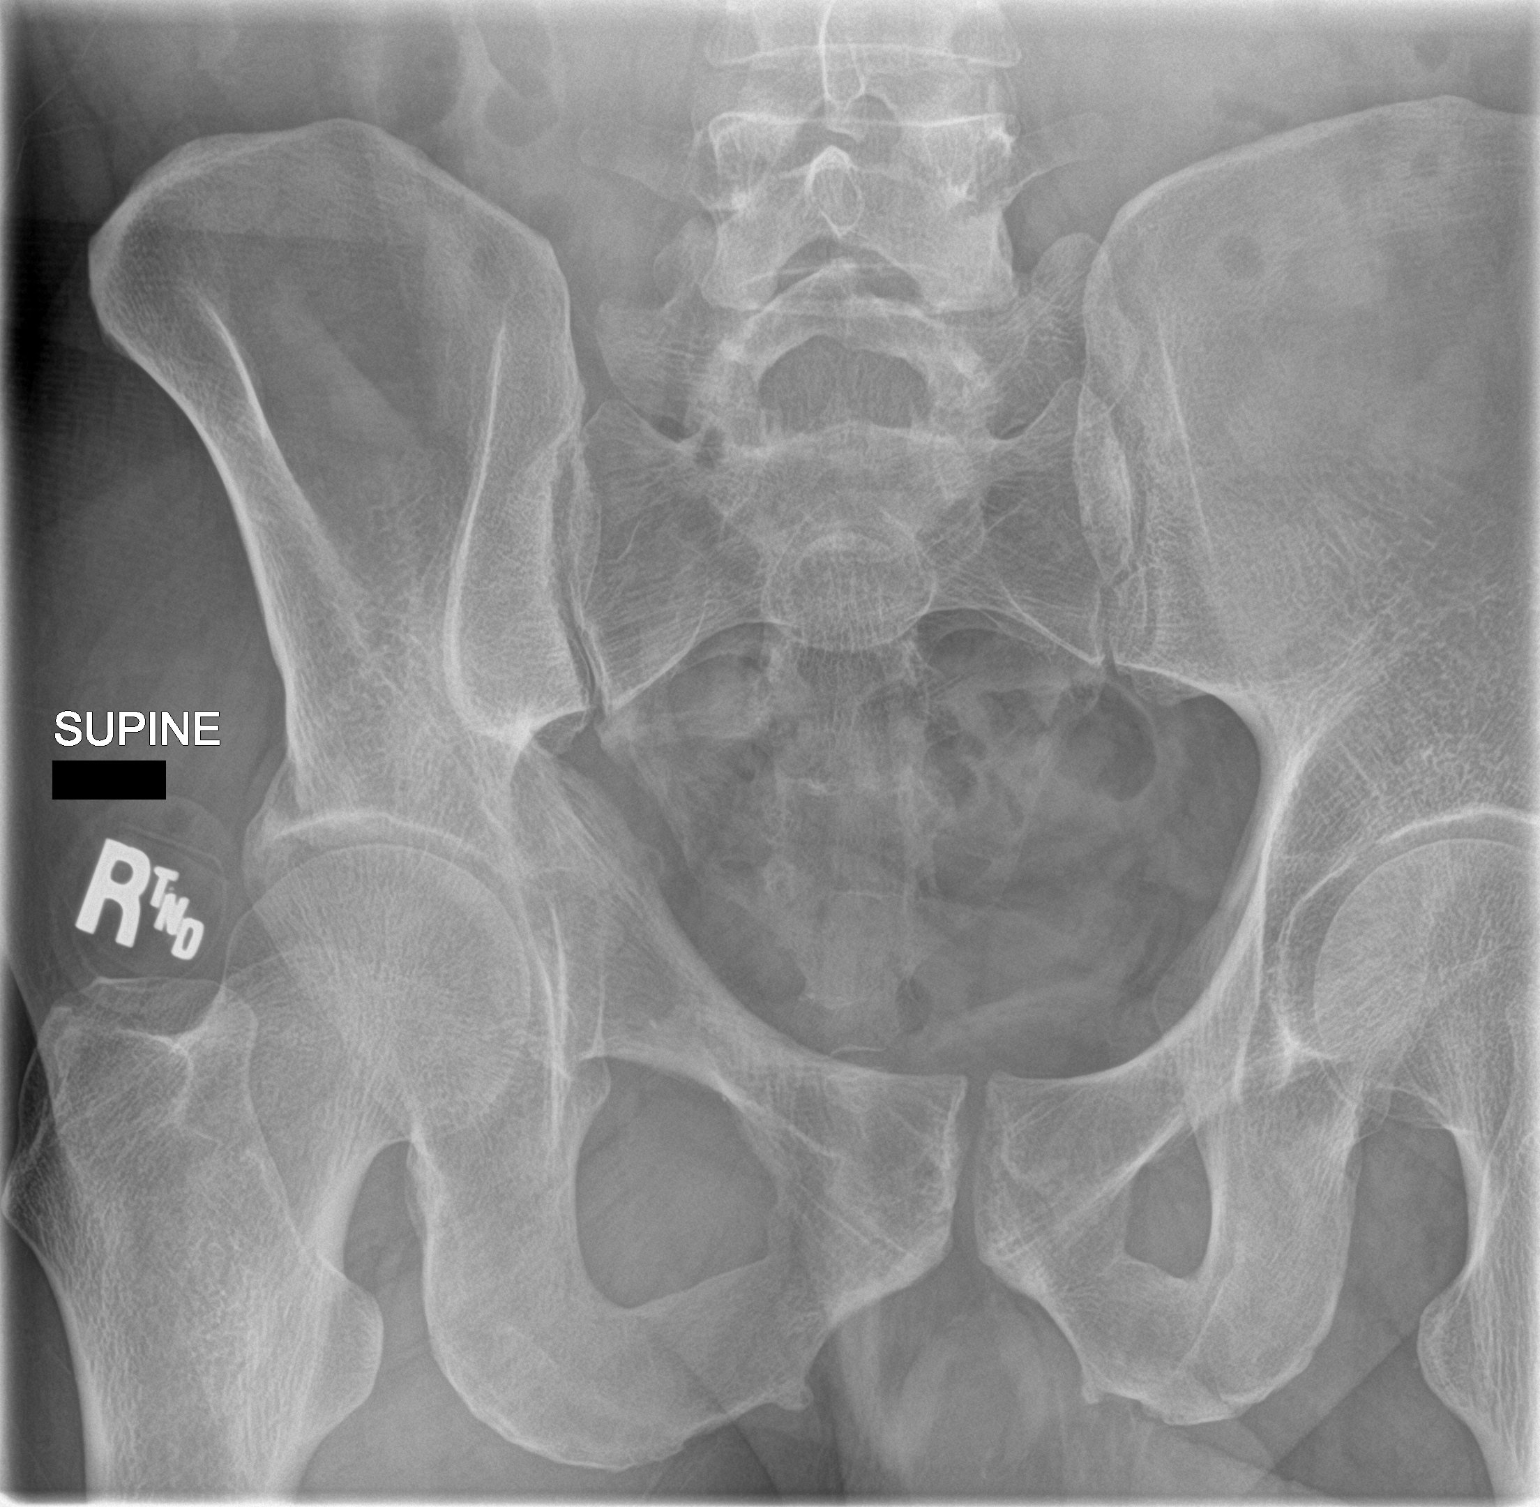

[2 of 2 positions shown; findings below may reference images not displayed]

FINDINGS: Coarse calcification projects over the lower pole right renal
shadow. No urolith visualized along the course of the right ureter
or elsewhere within the urinary tract. No suspicious calcifications
over the gallbladder fossa. Bowel gas pattern is normal. Osseous
structures are unremarkable.
IMPRESSION: Coarse 7 mm calcification projects over the lower pole right renal
shadow.

## 2021-08-18 ENCOUNTER — Other Ambulatory Visit: Payer: Self-pay

## 2021-08-18 DIAGNOSIS — Z87442 Personal history of urinary calculi: Secondary | ICD-10-CM

## 2021-08-18 MED ORDER — TAMSULOSIN HCL 0.4 MG PO CAPS
0.4000 mg | ORAL_CAPSULE | Freq: Every day | ORAL | 1 refills | Status: DC
Start: 2021-08-18 — End: 2021-08-20

## 2021-08-19 IMAGING — CR DG ABDOMEN 1V
2 series · 2 of 2 positions shown · non-contrast
Comparison: November 23, 2019.

CLINICAL DATA: Right nephrolithiasis.

EXAM:
ABDOMEN - 1 VIEW

[abdomen kub (1 of 2)]
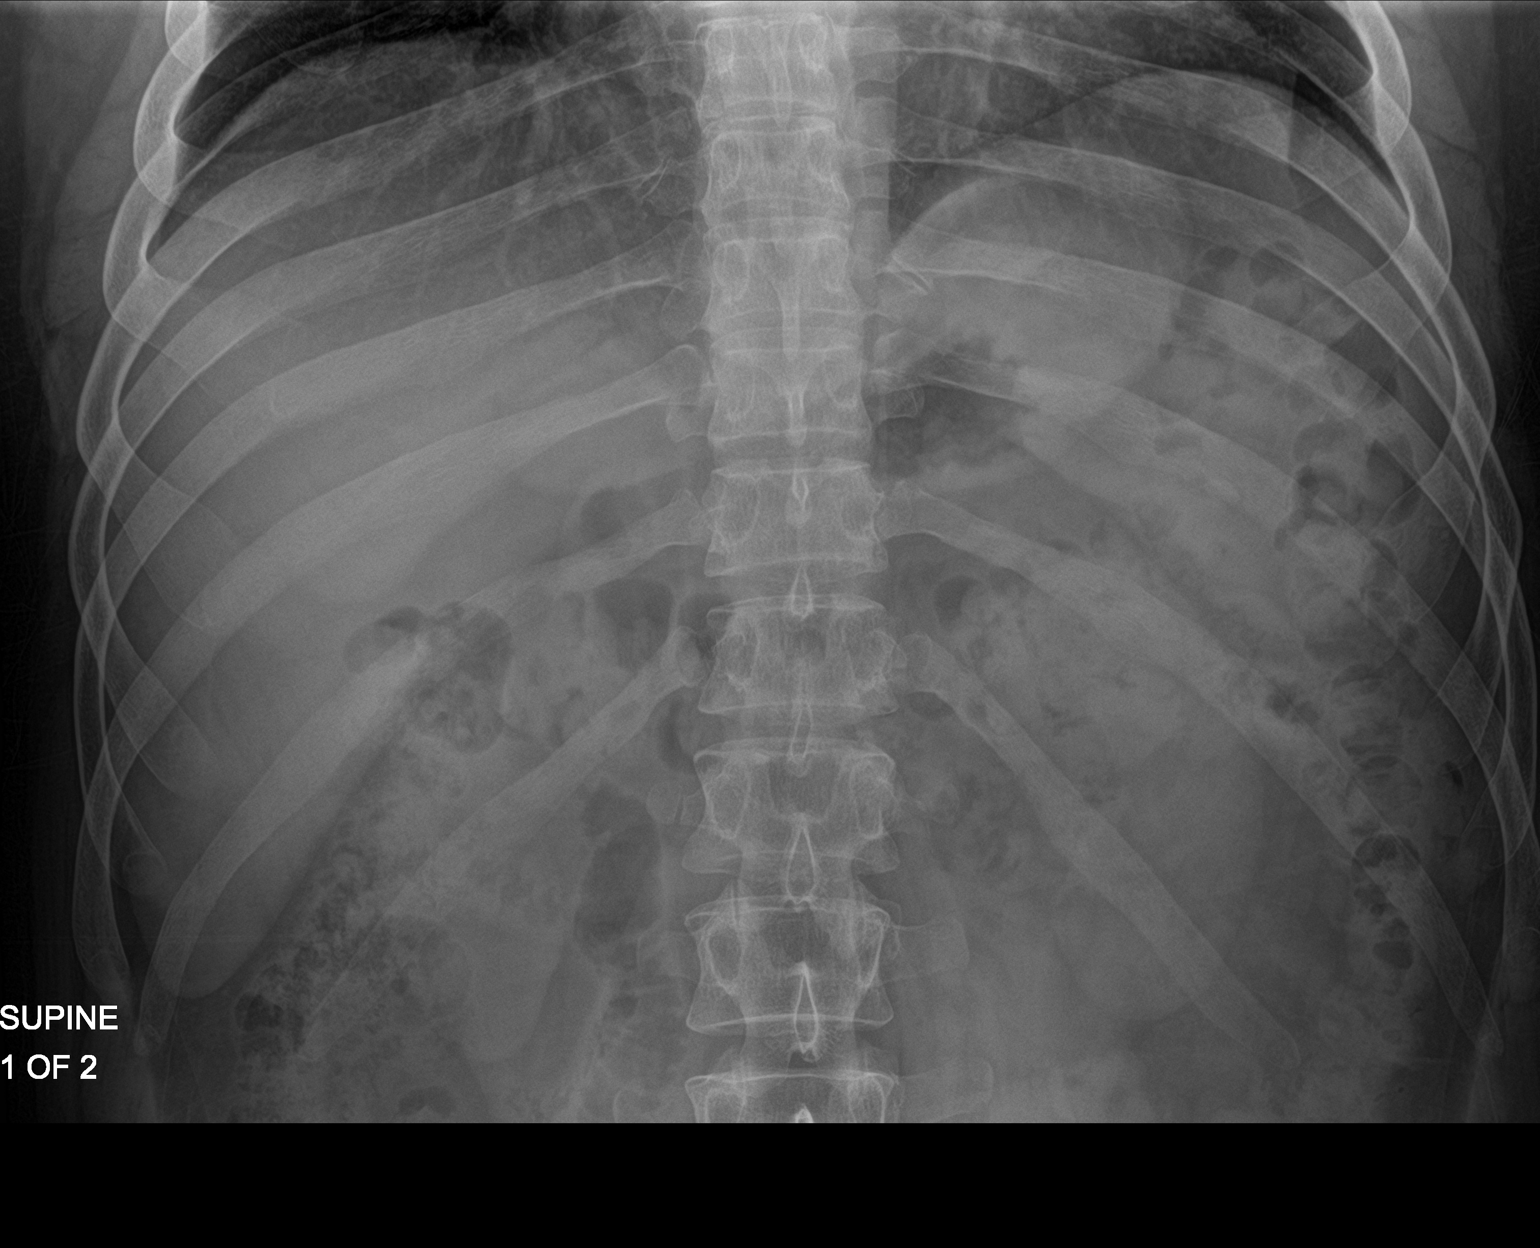

[abdomen kub (2 of 2)]
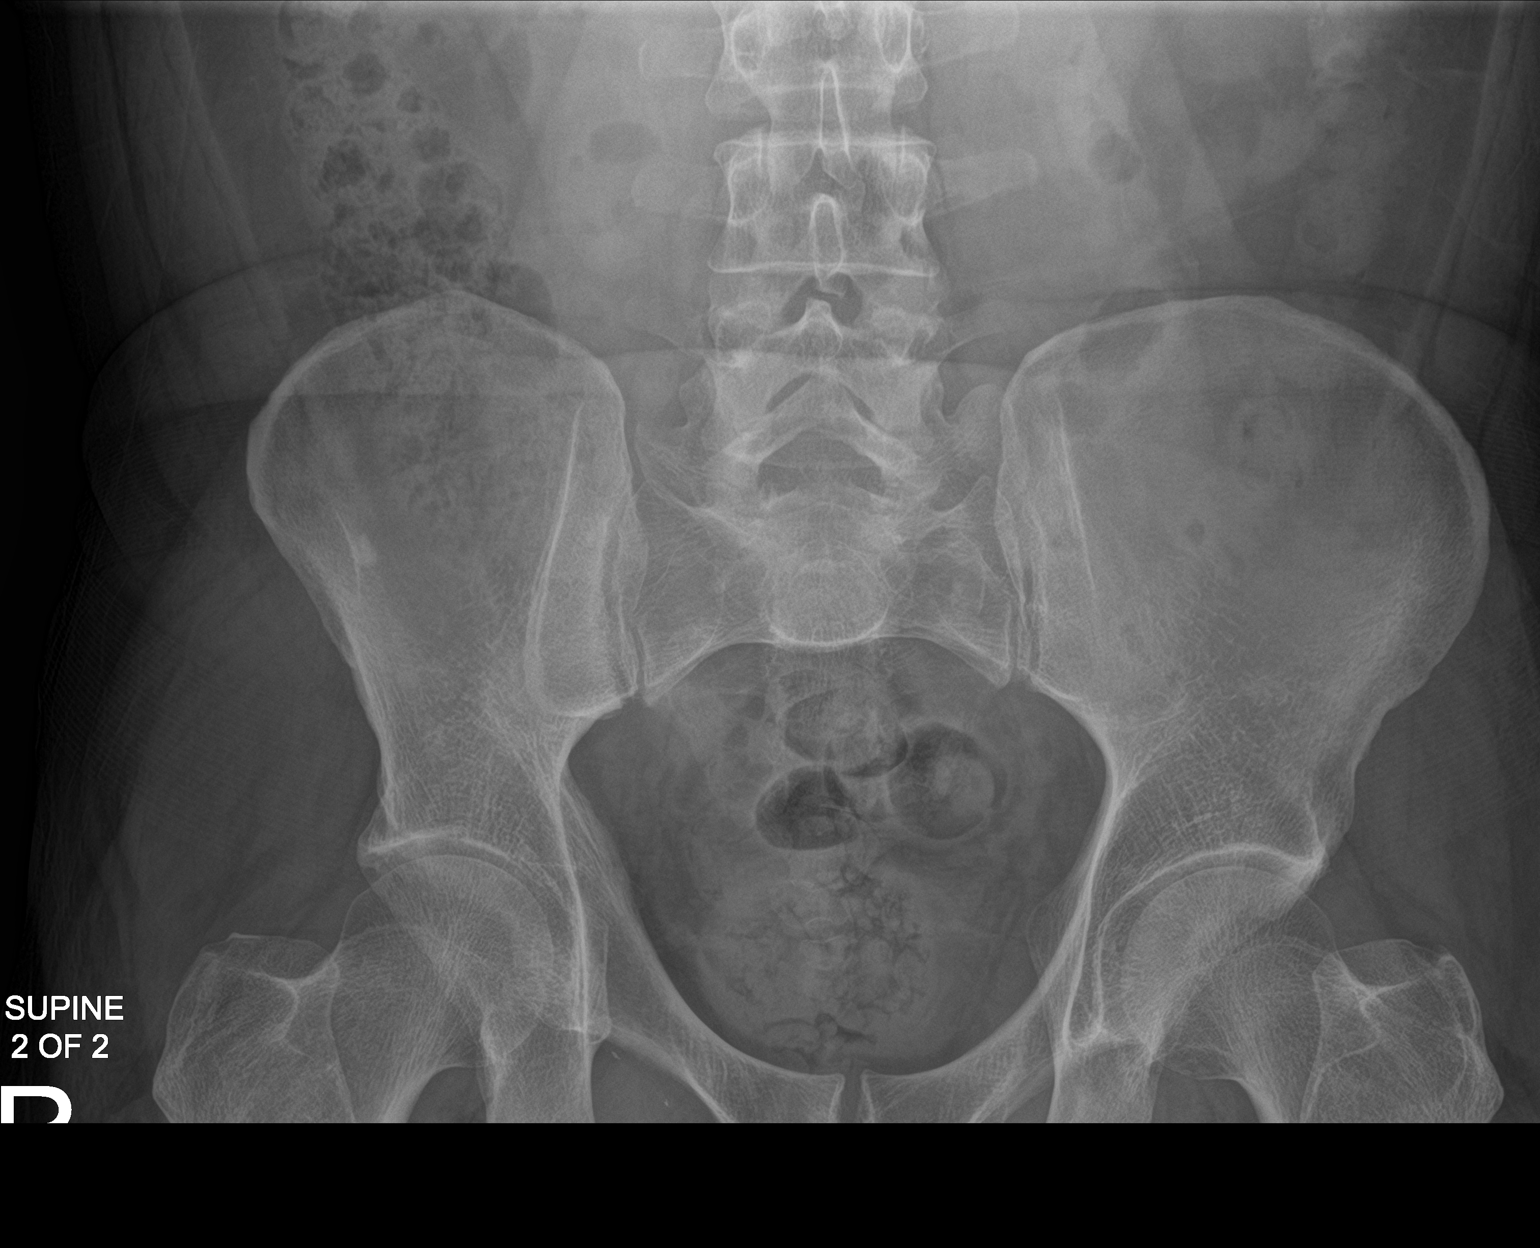

[2 of 2 positions shown; findings below may reference images not displayed]

FINDINGS: The bowel gas pattern is normal. No radio-opaque calculi or other
significant radiographic abnormality are seen.
IMPRESSION: No definite nephrolithiasis seen currently.

## 2021-08-20 ENCOUNTER — Other Ambulatory Visit: Payer: Self-pay | Admitting: *Deleted

## 2021-08-20 DIAGNOSIS — Z87442 Personal history of urinary calculi: Secondary | ICD-10-CM

## 2021-08-20 MED ORDER — TAMSULOSIN HCL 0.4 MG PO CAPS: 0.4000 mg | ORAL_CAPSULE | Freq: Every day | ORAL | 1 refills | Status: AC

## 2021-08-28 DIAGNOSIS — G4733 Obstructive sleep apnea (adult) (pediatric): Secondary | ICD-10-CM | POA: Diagnosis not present

## 2021-09-02 DIAGNOSIS — M722 Plantar fascial fibromatosis: Secondary | ICD-10-CM | POA: Diagnosis not present

## 2021-09-02 DIAGNOSIS — Q66221 Congenital metatarsus adductus, right foot: Secondary | ICD-10-CM | POA: Diagnosis not present

## 2021-09-02 DIAGNOSIS — M7671 Peroneal tendinitis, right leg: Secondary | ICD-10-CM | POA: Diagnosis not present

## 2021-09-02 DIAGNOSIS — M216X1 Other acquired deformities of right foot: Secondary | ICD-10-CM | POA: Diagnosis not present

## 2021-09-03 ENCOUNTER — Encounter: Payer: Self-pay | Admitting: Family Medicine

## 2021-09-03 NOTE — Telephone Encounter (Signed)
Please advise, thanks.

## 2021-09-04 ENCOUNTER — Other Ambulatory Visit: Payer: Self-pay

## 2021-09-04 MED ORDER — ESCITALOPRAM OXALATE 10 MG PO TABS: 10.0000 mg | ORAL_TABLET | Freq: Every day | ORAL | 2 refills | Status: DC

## 2021-09-28 DIAGNOSIS — G4733 Obstructive sleep apnea (adult) (pediatric): Secondary | ICD-10-CM | POA: Diagnosis not present

## 2021-09-30 ENCOUNTER — Other Ambulatory Visit: Payer: Self-pay | Admitting: Family Medicine

## 2021-10-24 ENCOUNTER — Telehealth: Payer: Self-pay

## 2021-10-24 NOTE — Telephone Encounter (Signed)
Vacsepa requiring PA  Completed via CoverMyMeds  Konrad Saha Key: [id number] - PA Case ID: [id number] - Rx #: [id number]

## 2021-10-29 DIAGNOSIS — G4733 Obstructive sleep apnea (adult) (pediatric): Secondary | ICD-10-CM | POA: Diagnosis not present

## 2021-10-31 NOTE — Telephone Encounter (Signed)
Outcome Approved on February 3 Request Reference Number: [id number]. ICOSAPENT CAP 1GM is approved through 10/24/2022.

## 2021-11-27 DIAGNOSIS — G4733 Obstructive sleep apnea (adult) (pediatric): Secondary | ICD-10-CM | POA: Diagnosis not present

## 2021-12-28 DIAGNOSIS — G4733 Obstructive sleep apnea (adult) (pediatric): Secondary | ICD-10-CM | POA: Diagnosis not present

## 2022-01-27 DIAGNOSIS — G4733 Obstructive sleep apnea (adult) (pediatric): Secondary | ICD-10-CM | POA: Diagnosis not present

## 2022-02-26 DIAGNOSIS — G4733 Obstructive sleep apnea (adult) (pediatric): Secondary | ICD-10-CM | POA: Diagnosis not present

## 2022-02-27 DIAGNOSIS — G4733 Obstructive sleep apnea (adult) (pediatric): Secondary | ICD-10-CM | POA: Diagnosis not present

## 2022-03-04 ENCOUNTER — Other Ambulatory Visit: Payer: Self-pay | Admitting: Nurse Practitioner

## 2022-03-04 DIAGNOSIS — M216X2 Other acquired deformities of left foot: Secondary | ICD-10-CM | POA: Diagnosis not present

## 2022-03-04 DIAGNOSIS — M79672 Pain in left foot: Secondary | ICD-10-CM | POA: Diagnosis not present

## 2022-03-04 DIAGNOSIS — E781 Pure hyperglyceridemia: Secondary | ICD-10-CM

## 2022-03-04 DIAGNOSIS — M216X1 Other acquired deformities of right foot: Secondary | ICD-10-CM | POA: Diagnosis not present

## 2022-03-04 DIAGNOSIS — M722 Plantar fascial fibromatosis: Secondary | ICD-10-CM | POA: Diagnosis not present

## 2022-03-04 DIAGNOSIS — M7732 Calcaneal spur, left foot: Secondary | ICD-10-CM | POA: Diagnosis not present

## 2022-03-05 NOTE — Telephone Encounter (Signed)
Requested medication (s) are due for refill today:   Yes  Requested medication (s) are on the active medication list:   Yes  Future visit scheduled:   No but has switched to Dr. Tanya Nones since Shanda Bumps has left.   Last ordered: 06/05/2021 #90, 1 refill  Returned because no protocol assigned to this medication.    Requested Prescriptions  Pending Prescriptions Disp Refills   fenofibrate 160 MG tablet [Pharmacy Med Name: FENOFIBRATE 160 MG TABLET] 90 tablet 1    Sig: TAKE 1 TABLET BY MOUTH EVERY DAY     There is no refill protocol information for this order

## 2022-03-28 ENCOUNTER — Other Ambulatory Visit: Payer: Self-pay | Admitting: Family Medicine

## 2022-03-28 DIAGNOSIS — G4733 Obstructive sleep apnea (adult) (pediatric): Secondary | ICD-10-CM | POA: Diagnosis not present

## 2022-03-29 ENCOUNTER — Other Ambulatory Visit: Payer: Self-pay | Admitting: Family Medicine

## 2022-03-29 DIAGNOSIS — E781 Pure hyperglyceridemia: Secondary | ICD-10-CM

## 2022-03-30 NOTE — Telephone Encounter (Signed)
Called pt, left VM to call back to schedule OV for additional refills. Will refill for 30 days, until pt makes OV.  Requested Prescriptions  Pending Prescriptions Disp Refills  . escitalopram (LEXAPRO) 10 MG tablet [Pharmacy Med Name: ESCITALOPRAM 10 MG TABLET] 30 tablet 0    Sig: TAKE 1 TABLET BY MOUTH EVERY DAY     Psychiatry:  Antidepressants - SSRI Failed - 03/28/2022  1:19 AM      Failed - Valid encounter within last 6 months    Recent Outpatient Visits          8 months ago Hypogonadism in male   [facility] Pickard, Priscille Heidelberg, MD   9 months ago Chronic gout of foot, unspecified cause, unspecified laterality   [facility] Valentino Nose, NP   1 year ago Hypogonadism in male   [facility] Pickard, Priscille Heidelberg, MD   3 years ago Hypogonadism in male   [facility] Pickard, Priscille Heidelberg, MD   3 years ago Hypogonadism in male   [facility] Pickard, Priscille Heidelberg, MD

## 2022-03-30 NOTE — Telephone Encounter (Signed)
Pt needs to scheduled  Requested Prescriptions  Pending Prescriptions Disp Refills  . fenofibrate 160 MG tablet [Pharmacy Med Name: FENOFIBRATE 160 MG TABLET] 90 tablet 0    Sig: TAKE 1 TABLET BY MOUTH EVERY DAY     Cardiovascular:  Antilipid - Fibric Acid Derivatives Failed - 03/29/2022  8:51 AM      Failed - ALT in normal range and within 360 days    ALT  Date Value Ref Range Status  06/05/2021 80 (H) 9 - 46 U/L Final         Failed - AST in normal range and within 360 days    AST  Date Value Ref Range Status  06/05/2021 43 (H) 10 - 40 U/L Final         Failed - Lipid Panel in normal range within the last 12 months    Cholesterol  Date Value Ref Range Status  06/05/2021 208 (H) <200 mg/dL Final   LDL Cholesterol (Calc)  Date Value Ref Range Status  06/05/2021 136 (H) mg/dL (calc) Final    Comment:    Reference range: <100 . Desirable range <100 mg/dL for primary prevention;   <70 mg/dL for patients with CHD or diabetic patients  with > or = 2 CHD risk factors. Marland Kitchen LDL-C is now calculated using the Martin-Hopkins  calculation, which is a validated novel method providing  better accuracy than the Friedewald equation in the  estimation of LDL-C.  Cresenciano Genre et al. Annamaria Helling. 7253;664(40): 2061-2068  (http://education.[facility].com/faq/FAQ164)    HDL  Date Value Ref Range Status  06/05/2021 28 (L) > OR = 40 mg/dL Final   Triglycerides  Date Value Ref Range Status  06/05/2021 311 (H) <150 mg/dL Final    Comment:    . If a non-fasting specimen was collected, consider repeat triglyceride testing on a fasting specimen if clinically indicated.  Yates Decamp et al. J. of Clin. Lipidol. 3474;2:595-638. Marland Kitchen          Passed - Cr in normal range and within 360 days    Creat  Date Value Ref Range Status  06/05/2021 1.17 0.60 - 1.29 mg/dL Final         Passed - HGB in normal range and within 360 days    Hemoglobin  Date Value Ref Range Status  06/05/2021 15.1 13.2 -  17.1 g/dL Final         Passed - HCT in normal range and within 360 days    HCT  Date Value Ref Range Status  06/05/2021 44.0 38.5 - 50.0 % Final         Passed - PLT in normal range and within 360 days    Platelets  Date Value Ref Range Status  06/05/2021 347 140 - 400 Thousand/uL Final         Passed - WBC in normal range and within 360 days    WBC  Date Value Ref Range Status  06/05/2021 5.8 3.8 - 10.8 Thousand/uL Final         Passed - eGFR is 30 or above and within 360 days    GFR, Est African American  Date Value Ref Range Status  04/29/2020 77 > OR = 60 mL/min/1.32m Final   GFR, Est Non African American  Date Value Ref Range Status  04/29/2020 67 > OR = 60 mL/min/1.747mFinal   eGFR  Date Value Ref Range Status  06/05/2021 81 > OR = 60 mL/min/1.7373minal    Comment:  The eGFR is based on the CKD-EPI 2021 equation. To calculate  the new eGFR from a previous Creatinine or Cystatin C result, go to https://www.kidney.org/professionals/ kdoqi/gfr%5Fcalculator          Passed - Valid encounter within last 12 months    Recent Outpatient Visits          8 months ago Hypogonadism in male   [place] Pickard, Cammie Mcgee, MD   9 months ago Chronic gout of foot, unspecified cause, unspecified laterality   [place] Eulogio Bear, NP   1 year ago Hypogonadism in male   [place], Cammie Mcgee, MD   3 years ago Hypogonadism in male   [place], Cammie Mcgee, MD   3 years ago Hypogonadism in male   [place], Cammie Mcgee, MD

## 2022-04-15 ENCOUNTER — Other Ambulatory Visit: Payer: Self-pay | Admitting: Nurse Practitioner

## 2022-04-15 DIAGNOSIS — M1A0790 Idiopathic chronic gout, unspecified ankle and foot, without tophus (tophi): Secondary | ICD-10-CM

## 2022-04-15 NOTE — Telephone Encounter (Signed)
CALL PT TO MAKE AN ANNUAL VISIT FOR FUTURE MED REFILLS, THIS IS A CURTSEY REFILL

## 2022-04-22 ENCOUNTER — Other Ambulatory Visit: Payer: Self-pay | Admitting: Family Medicine

## 2022-04-23 NOTE — Telephone Encounter (Signed)
Patient called, left VM to return the call to the office to scheduled an appt for medication refill request.   

## 2022-04-23 NOTE — Telephone Encounter (Signed)
Requested medication (s) are due for refill today: yes  Requested medication (s) are on the active medication list: yes  Last refill:  03/30/22 #30/0  Future visit scheduled: no  Notes to clinic:  Unable to refill per protocol, appointment needed.      Requested Prescriptions  Pending Prescriptions Disp Refills   escitalopram (LEXAPRO) 10 MG tablet [Pharmacy Med Name: ESCITALOPRAM 10 MG TABLET] 90 tablet 1    Sig: TAKE 1 TABLET BY MOUTH EVERY DAY     Psychiatry:  Antidepressants - SSRI Failed - 04/22/2022  1:31 PM      Failed - Valid encounter within last 6 months    Recent Outpatient Visits           9 months ago Hypogonadism in male   [facility] Pick[place], Priscille Heidelberg, MD   10 months ago Chronic gout of foot, unspecified cause, unspecified laterality   [facility] Valentino Nose, NP   1 year ago Hypogonadism in male   [facility] Pickard, Priscille Heidelberg, MD   3 years ago Hypogonadism in male   [facility] Pickard, Priscille Heidelberg, MD   3 years ago Hypogonadism in male   [facility] Pickard, Priscille Heidelberg, MD

## 2022-04-28 DIAGNOSIS — G4733 Obstructive sleep apnea (adult) (pediatric): Secondary | ICD-10-CM | POA: Diagnosis not present

## 2022-05-12 ENCOUNTER — Other Ambulatory Visit: Payer: Self-pay | Admitting: Family Medicine

## 2022-05-12 DIAGNOSIS — M1A0790 Idiopathic chronic gout, unspecified ankle and foot, without tophus (tophi): Secondary | ICD-10-CM

## 2022-05-29 DIAGNOSIS — G4733 Obstructive sleep apnea (adult) (pediatric): Secondary | ICD-10-CM | POA: Diagnosis not present

## 2022-06-01 ENCOUNTER — Telehealth: Payer: Self-pay | Admitting: Family Medicine

## 2022-06-01 NOTE — Telephone Encounter (Signed)
Left message to return call to schedule annual cpe; no additional refills without an appointment as per note in chart.

## 2022-06-02 DIAGNOSIS — G4733 Obstructive sleep apnea (adult) (pediatric): Secondary | ICD-10-CM | POA: Diagnosis not present

## 2022-06-10 ENCOUNTER — Other Ambulatory Visit: Payer: Self-pay | Admitting: Family Medicine

## 2022-06-10 DIAGNOSIS — M1A0790 Idiopathic chronic gout, unspecified ankle and foot, without tophus (tophi): Secondary | ICD-10-CM

## 2022-06-11 NOTE — Telephone Encounter (Signed)
Called, left vm to call back to schedule OV. Will refill medication for 30 days until OV is made. OV needed for additional refills.  Requested Prescriptions  Pending Prescriptions Disp Refills  . allopurinol (ZYLOPRIM) 300 MG tablet [Pharmacy Med Name: ALLOPURINOL 300 MG TABLET] 30 tablet 1    Sig: TAKE 1 TABLET BY MOUTH EVERY DAY     Endocrinology:  Gout Agents - allopurinol Failed - 06/10/2022  2:31 PM      Failed - Uric Acid in normal range and within 360 days    Uric Acid, Serum  Date Value Ref Range Status  11/13/2019 4.9 3.7 - 8.6 mg/dL Final    Comment:    Performed at [facility], [address], [place], [place] [address]         Failed - Cr in normal range and within 360 days    Creat  Date Value Ref Range Status  06/05/2021 1.17 0.60 - 1.29 mg/dL Final         Failed - Valid encounter within last 12 months    Recent Outpatient Visits          11 months ago Hypogonadism in male   [place] Pickard, Cammie Mcgee, MD   1 year ago Chronic gout of foot, unspecified cause, unspecified laterality   [place] Eulogio Bear, NP   2 years ago Hypogonadism in male   [place], Cammie Mcgee, MD   3 years ago Hypogonadism in male   [place], Cammie Mcgee, MD   4 years ago Hypogonadism in male   [place], Cammie Mcgee, MD             Failed - CBC within normal limits and completed in the last 12 months    WBC  Date Value Ref Range Status  06/05/2021 5.8 3.8 - 10.8 Thousand/uL Final   RBC  Date Value Ref Range Status  06/05/2021 4.69 4.20 - 5.80 Million/uL Final   Hemoglobin  Date Value Ref Range Status  06/05/2021 15.1 13.2 - 17.1 g/dL Final   HCT  Date Value Ref Range Status  06/05/2021 44.0 38.5 - 50.0 % Final   MCHC  Date Value Ref Range Status  06/05/2021 34.3 32.0 - 36.0 g/dL Final   [facility]  Date Value Ref Range Status  06/05/2021 32.2  27.0 - 33.0 pg Final   MCV  Date Value Ref Range Status  06/05/2021 93.8 80.0 - 100.0 fL Final   No results found for: "PLTCOUNTKUC", "LABPLAT", "POCPLA" RDW  Date Value Ref Range Status  06/05/2021 12.2 11.0 - 15.0 % Final

## 2022-06-12 ENCOUNTER — Telehealth: Payer: Self-pay

## 2022-06-12 NOTE — Telephone Encounter (Signed)
PA-Vasepa sent to plan

## 2022-06-16 ENCOUNTER — Other Ambulatory Visit: Payer: Self-pay

## 2022-06-16 MED ORDER — ICOSAPENT ETHYL 1 G PO CAPS: 2.0000 g | ORAL_CAPSULE | Freq: Two times a day (BID) | ORAL | 11 refills | Status: AC

## 2022-06-26 ENCOUNTER — Other Ambulatory Visit: Payer: Self-pay | Admitting: Family Medicine

## 2022-06-26 DIAGNOSIS — E781 Pure hyperglyceridemia: Secondary | ICD-10-CM

## 2022-06-26 NOTE — Telephone Encounter (Signed)
Requested medication (s) are due for refill today: yes  Requested medication (s) are on the active medication list: yes  Last refill:  03/30/22 #90 with 0 RF   Future visit scheduled: no, last visit 07/10/22  Notes to clinic:  Has already had a curtesy refill and there is no upcoming appointment scheduled, labs are from 06/05/2021, please assess..       Requested Prescriptions  Pending Prescriptions Disp Refills   fenofibrate 160 MG tablet [Pharmacy Med Name: FENOFIBRATE 160 MG TABLET] 90 tablet 0    Sig: TAKE 1 TABLET BY MOUTH EVERY DAY     Cardiovascular:  Antilipid - Fibric Acid Derivatives Failed - 06/26/2022  2:31 AM      Failed - ALT in normal range and within 360 days    ALT  Date Value Ref Range Status  06/05/2021 80 (H) 9 - 46 U/L Final         Failed - AST in normal range and within 360 days    AST  Date Value Ref Range Status  06/05/2021 43 (H) 10 - 40 U/L Final         Failed - Cr in normal range and within 360 days    Creat  Date Value Ref Range Status  06/05/2021 1.17 0.60 - 1.29 mg/dL Final         Failed - HGB in normal range and within 360 days    Hemoglobin  Date Value Ref Range Status  06/05/2021 15.1 13.2 - 17.1 g/dL Final         Failed - HCT in normal range and within 360 days    HCT  Date Value Ref Range Status  06/05/2021 44.0 38.5 - 50.0 % Final         Failed - PLT in normal range and within 360 days    Platelets  Date Value Ref Range Status  06/05/2021 347 140 - 400 Thousand/uL Final         Failed - WBC in normal range and within 360 days    WBC  Date Value Ref Range Status  06/05/2021 5.8 3.8 - 10.8 Thousand/uL Final         Failed - eGFR is 30 or above and within 360 days    GFR, Est African American  Date Value Ref Range Status  04/29/2020 77 > OR = 60 mL/min/1.73m Final   GFR, Est Non African American  Date Value Ref Range Status  04/29/2020 67 > OR = 60 mL/min/1.786mFinal   eGFR  Date Value Ref Range Status   06/05/2021 81 > OR = 60 mL/min/1.7319minal    Comment:    The eGFR is based on the CKD-EPI 2021 equation. To calculate  the new eGFR from a previous Creatinine or Cystatin C result, go to https://www.kidney.org/professionals/ kdoqi/gfr%5Fcalculator          Failed - Valid encounter within last 12 months    Recent Outpatient Visits           11 months ago Hypogonadism in male   [place]   1 year ago Chronic gout of foot, unspecified cause, unspecified laterality   [place]   2 years ago Hypogonadism in male   [place]   3 years ago Hypogonadism in male   [place]   4 years ago Hypogonadism  in male   [place], Cammie Mcgee, MD              Failed - Lipid Panel in normal range within the last 12 months    Cholesterol  Date Value Ref Range Status  06/05/2021 208 (H) <200 mg/dL Final   LDL Cholesterol (Calc)  Date Value Ref Range Status  06/05/2021 136 (H) mg/dL (calc) Final    Comment:    Reference range: <100 . Desirable range <100 mg/dL for primary prevention;   <70 mg/dL for patients with CHD or diabetic patients  with > or = 2 CHD risk factors. Marland Kitchen LDL-C is now calculated using the Martin-Hopkins  calculation, which is a validated novel method providing  better accuracy than the Friedewald equation in the  estimation of LDL-C.  Cresenciano Genre et al. Annamaria Helling. 1275;170(01): 2061-2068  (http://education.[facility].com/faq/FAQ164)    HDL  Date Value Ref Range Status  06/05/2021 28 (L) > OR = 40 mg/dL Final   Triglycerides  Date Value Ref Range Status  06/05/2021 311 (H) <150 mg/dL Final    Comment:    . If a non-fasting specimen was collected, consider repeat triglyceride testing on a fasting specimen if clinically indicated.  Yates Decamp et al. J. of Clin. Lipidol.  7494;4:967-591. Marland Kitchen

## 2022-07-01 NOTE — Telephone Encounter (Signed)
PA Denid 06/12/22

## 2022-07-07 ENCOUNTER — Other Ambulatory Visit: Payer: Self-pay | Admitting: Family Medicine

## 2022-07-07 DIAGNOSIS — M1A0790 Idiopathic chronic gout, unspecified ankle and foot, without tophus (tophi): Secondary | ICD-10-CM

## 2022-07-07 NOTE — Telephone Encounter (Signed)
Requested medication (s) are due for refill today: Due 07/11/22  Requested medication (s) are on the active medication list: yes    Last refill: 06/11/22 #30  0 refills  Future visit scheduled no  Notes to clinic: Attempted to reach pt to secure appt.due. Left VM to call back. Refill of 06/11/22 was courtesy refill.  Requested Prescriptions  Pending Prescriptions Disp Refills   allopurinol (ZYLOPRIM) 300 MG tablet [Pharmacy Med Name: ALLOPURINOL 300 MG TABLET] 90 tablet 1    Sig: TAKE 1 TABLET BY [place] DAY     Endocrinology:  Gout Agents - allopurinol Failed - 07/07/2022  2:32 PM      Failed - Uric Acid in normal range and within 360 days    Uric Acid, Serum  Date Value Ref Range Status  11/13/2019 4.9 3.7 - 8.6 mg/dL Final    Comment:    Performed at [facility] [address], [address] [place], [place] [address]         Failed - Cr in normal range and within 360 days    Creat  Date Value Ref Range Status  06/05/2021 1.17 0.60 - 1.29 mg/dL Final         Failed - Valid encounter within last 12 months    Recent Outpatient Visits           12 months ago Hypogonadism in male   [place] Pickard, Cammie Mcgee, MD   1 year ago Chronic gout of foot, unspecified cause, unspecified laterality   [place] Eulogio Bear, NP   2 years ago Hypogonadism in male   [place], Cammie Mcgee, MD   3 years ago Hypogonadism in male   [place], Cammie Mcgee, MD   4 years ago Hypogonadism in male   [place], Cammie Mcgee, MD              Failed - CBC within normal limits and completed in the last 12 months    WBC  Date Value Ref Range Status  06/05/2021 5.8 3.8 - 10.8 Thousand/uL Final   RBC  Date Value Ref Range Status  06/05/2021 4.69 4.20 - 5.80 Million/uL Final   Hemoglobin  Date Value Ref Range Status  06/05/2021 15.1 13.2 - 17.1 g/dL Final   HCT  Date  Value Ref Range Status  06/05/2021 44.0 38.5 - 50.0 % Final   MCHC  Date Value Ref Range Status  06/05/2021 34.3 32.0 - 36.0 g/dL Final   [facility]  Date Value Ref Range Status  06/05/2021 32.2 27.0 - 33.0 pg Final   MCV  Date Value Ref Range Status  06/05/2021 93.8 80.0 - 100.0 fL Final   No results found for: "PLTCOUNTKUC", "LABPLAT", "POCPLA" RDW  Date Value Ref Range Status  06/05/2021 12.2 11.0 - 15.0 % Final

## 2022-09-30 ENCOUNTER — Other Ambulatory Visit: Payer: Self-pay | Admitting: Family Medicine

## 2022-09-30 DIAGNOSIS — E781 Pure hyperglyceridemia: Secondary | ICD-10-CM

## 2022-10-01 NOTE — Telephone Encounter (Signed)
Unable to refill per protocol, Rx request is too soon. Last refill for Lexapro 04/24/22 for 90 and 1 refill. Patient needs OV.  Requested Prescriptions  Pending Prescriptions Disp Refills   escitalopram (LEXAPRO) 10 MG tablet [Pharmacy Med Name: ESCITALOPRAM 10 MG TABLET] 90 tablet 1    Sig: TAKE 1 TABLET BY MOUTH EVERY DAY     Psychiatry:  Antidepressants - SSRI Failed - 09/30/2022  6:27 PM      Failed - Valid encounter within last 6 months    Recent Outpatient Visits           1 year ago Hypogonadism in male   [place] Pickard, Cammie Mcgee, MD   1 year ago Chronic gout of foot, unspecified cause, unspecified laterality   [place] Eulogio Bear, NP   2 years ago Hypogonadism in male   [place], Cammie Mcgee, MD   3 years ago Hypogonadism in male   [place], Cammie Mcgee, MD   4 years ago Hypogonadism in male   [place], Cammie Mcgee, MD               fenofibrate 160 MG tablet [Pharmacy Med Name: FENOFIBRATE 160 MG TABLET] 90 tablet 0    Sig: TAKE 1 TABLET BY MOUTH EVERY DAY     Cardiovascular:  Antilipid - Fibric Acid Derivatives Failed - 09/30/2022  6:27 PM      Failed - ALT in normal range and within 360 days    ALT  Date Value Ref Range Status  06/05/2021 80 (H) 9 - 46 U/L Final         Failed - AST in normal range and within 360 days    AST  Date Value Ref Range Status  06/05/2021 43 (H) 10 - 40 U/L Final         Failed - Cr in normal range and within 360 days    Creat  Date Value Ref Range Status  06/05/2021 1.17 0.60 - 1.29 mg/dL Final         Failed - HGB in normal range and within 360 days    Hemoglobin  Date Value Ref Range Status  06/05/2021 15.1 13.2 - 17.1 g/dL Final         Failed - HCT in normal range and within 360 days    HCT  Date Value Ref Range Status  06/05/2021 44.0 38.5 - 50.0 % Final         Failed - PLT in normal range  and within 360 days    Platelets  Date Value Ref Range Status  06/05/2021 347 140 - 400 Thousand/uL Final         Failed - WBC in normal range and within 360 days    WBC  Date Value Ref Range Status  06/05/2021 5.8 3.8 - 10.8 Thousand/uL Final         Failed - eGFR is 30 or above and within 360 days    GFR, Est African American  Date Value Ref Range Status  04/29/2020 77 > OR = 60 mL/min/1.1m2 Final   GFR, Est Non African American  Date Value Ref Range Status  04/29/2020 67 > OR = 60 mL/min/1.45m2 Final   eGFR  Date Value Ref Range Status  06/05/2021 81 > OR = 60 mL/min/1.31m2 Final    Comment:    The eGFR is based on the CKD-EPI 2021  equation. To calculate  the new eGFR from a previous Creatinine or Cystatin C result, go to https://www.kidney.org/professionals/ kdoqi/gfr%5Fcalculator          Failed - Valid encounter within last 12 months    Recent Outpatient Visits           1 year ago Hypogonadism in male   [place] Pickard, Cammie Mcgee, MD   1 year ago Chronic gout of foot, unspecified cause, unspecified laterality   [place] Eulogio Bear, NP   2 years ago Hypogonadism in male   [place], Cammie Mcgee, MD   3 years ago Hypogonadism in male   [place], Cammie Mcgee, MD   4 years ago Hypogonadism in male   [place], Cammie Mcgee, MD              Failed - Lipid Panel in normal range within the last 12 months    Cholesterol  Date Value Ref Range Status  06/05/2021 208 (H) <200 mg/dL Final   LDL Cholesterol (Calc)  Date Value Ref Range Status  06/05/2021 136 (H) mg/dL (calc) Final    Comment:    Reference range: <100 . Desirable range <100 mg/dL for primary prevention;   <70 mg/dL for patients with CHD or diabetic patients  with > or = 2 CHD risk factors. Marland Kitchen LDL-C is now calculated using the Martin-Hopkins  calculation, which is a  validated novel method providing  better accuracy than the Friedewald equation in the  estimation of LDL-C.  Cresenciano Genre et al. Annamaria Helling. 8937;342(87): 2061-2068  (http://education.[facility].com/faq/FAQ164)    HDL  Date Value Ref Range Status  06/05/2021 28 (L) > OR = 40 mg/dL Final   Triglycerides  Date Value Ref Range Status  06/05/2021 311 (H) <150 mg/dL Final    Comment:    . If a non-fasting specimen was collected, consider repeat triglyceride testing on a fasting specimen if clinically indicated.  Yates Decamp et al. J. of Clin. Lipidol. 6811;5:726-203. Marland Kitchen

## 2022-10-09 ENCOUNTER — Other Ambulatory Visit: Payer: Self-pay | Admitting: Family Medicine

## 2022-10-09 DIAGNOSIS — E781 Pure hyperglyceridemia: Secondary | ICD-10-CM

## 2022-10-17 DIAGNOSIS — G4733 Obstructive sleep apnea (adult) (pediatric): Secondary | ICD-10-CM | POA: Diagnosis not present

## 2022-11-17 DIAGNOSIS — G4733 Obstructive sleep apnea (adult) (pediatric): Secondary | ICD-10-CM | POA: Diagnosis not present

## 2022-11-21 ENCOUNTER — Other Ambulatory Visit: Payer: Self-pay | Admitting: Family Medicine

## 2022-11-21 DIAGNOSIS — M1A0790 Idiopathic chronic gout, unspecified ankle and foot, without tophus (tophi): Secondary | ICD-10-CM

## 2022-11-21 DIAGNOSIS — E781 Pure hyperglyceridemia: Secondary | ICD-10-CM

## 2022-12-16 DIAGNOSIS — G4733 Obstructive sleep apnea (adult) (pediatric): Secondary | ICD-10-CM | POA: Diagnosis not present

## 2023-02-19 ENCOUNTER — Other Ambulatory Visit: Payer: Self-pay | Admitting: Family Medicine

## 2023-02-19 DIAGNOSIS — E781 Pure hyperglyceridemia: Secondary | ICD-10-CM

## 2023-02-19 NOTE — Telephone Encounter (Signed)
Requested medications are due for refill today.  yes  Requested medications are on the active medications list.  yes  Last refill. 11/23/2022 #90 0 rf  Future visit scheduled.   no  Notes to clinic.  Pt last seen 05/2021.     Requested Prescriptions  Pending Prescriptions Disp Refills   fenofibrate 160 MG tablet [Pharmacy Med Name: FENOFIBRATE 160 MG TABLET] 90 tablet 0    Sig: TAKE 1 TABLET BY MOUTH EVERY DAY     Cardiovascular:  Antilipid - Fibric Acid Derivatives Failed - 02/19/2023  2:39 AM      Failed - ALT in normal range and within 360 days    ALT  Date Value Ref Range Status  06/05/2021 80 (H) 9 - 46 U/L Final         Failed - AST in normal range and within 360 days    AST  Date Value Ref Range Status  06/05/2021 43 (H) 10 - 40 U/L Final         Failed - Cr in normal range and within 360 days    Creat  Date Value Ref Range Status  06/05/2021 1.17 0.60 - 1.29 mg/dL Final         Failed - HGB in normal range and within 360 days    Hemoglobin  Date Value Ref Range Status  06/05/2021 15.1 13.2 - 17.1 g/dL Final         Failed - HCT in normal range and within 360 days    HCT  Date Value Ref Range Status  06/05/2021 44.0 38.5 - 50.0 % Final         Failed - PLT in normal range and within 360 days    Platelets  Date Value Ref Range Status  06/05/2021 347 140 - 400 Thousand/uL Final         Failed - WBC in normal range and within 360 days    WBC  Date Value Ref Range Status  06/05/2021 5.8 3.8 - 10.8 Thousand/uL Final         Failed - eGFR is 30 or above and within 360 days    GFR, Est African American  Date Value Ref Range Status  04/29/2020 77 > OR = 60 mL/min/1.38m2 Final   GFR, Est Non African American  Date Value Ref Range Status  04/29/2020 67 > OR = 60 mL/min/1.64m2 Final   eGFR  Date Value Ref Range Status  06/05/2021 81 > OR = 60 mL/min/1.5m2 Final    Comment:    The eGFR is based on the CKD-EPI 2021 equation. To calculate  the new eGFR  from a previous Creatinine or Cystatin C result, go to https://www.kidney.org/professionals/ kdoqi/gfr%5Fcalculator          Failed - Valid encounter within last 12 months    Recent Outpatient Visits           1 year ago Hypogonadism in male   [facility] [place], Priscille Heidelberg, MD   1 year ago Chronic gout of foot, unspecified cause, unspecified laterality   [facility] Valentino Nose, NP   2 years ago Hypogonadism in male   [facility] Tanya Nones, Priscille Heidelberg, MD   3 years ago Hypogonadism in male   [facility] Pickard, Priscille Heidelberg, MD   4 years ago Hypogonadism in male   [facility] Pickard, Priscille Heidelberg, MD  Failed - Lipid Panel in normal range within the last 12 months    Cholesterol  Date Value Ref Range Status  06/05/2021 208 (H) <200 mg/dL Final   LDL Cholesterol (Calc)  Date Value Ref Range Status  06/05/2021 136 (H) mg/dL (calc) Final    Comment:    Reference range: <100 . Desirable range <100 mg/dL for primary prevention;   <70 mg/dL for patients with CHD or diabetic patients  with > or = 2 CHD risk factors. Marland Kitchen LDL-C is now calculated using the Martin-Hopkins  calculation, which is a validated novel method providing  better accuracy than the Friedewald equation in the  estimation of LDL-C.  Horald Pollen et al. Lenox Ahr. 1610;960(45): 2061-2068  (http://education.[facility].com/faq/FAQ164)    HDL  Date Value Ref Range Status  06/05/2021 28 (L) > OR = 40 mg/dL Final   Triglycerides  Date Value Ref Range Status  06/05/2021 311 (H) <150 mg/dL Final    Comment:    . If a non-fasting specimen was collected, consider repeat triglyceride testing on a fasting specimen if clinically indicated.  Perry Mount et al. J. of Clin. Lipidol. 2015;9:129-169. Marland Kitchen

## 2023-02-25 DIAGNOSIS — G4733 Obstructive sleep apnea (adult) (pediatric): Secondary | ICD-10-CM | POA: Diagnosis not present

## 2023-03-04 ENCOUNTER — Ambulatory Visit: Payer: BC Managed Care – PPO | Admitting: Family Medicine

## 2023-03-04 ENCOUNTER — Encounter: Payer: Self-pay | Admitting: Family Medicine

## 2023-03-04 VITALS — BP 126/82 | HR 83 | Temp 98.2°F | Ht 70.0 in | Wt 217.8 lb

## 2023-03-04 DIAGNOSIS — M1A0790 Idiopathic chronic gout, unspecified ankle and foot, without tophus (tophi): Secondary | ICD-10-CM | POA: Diagnosis not present

## 2023-03-04 DIAGNOSIS — E781 Pure hyperglyceridemia: Secondary | ICD-10-CM | POA: Diagnosis not present

## 2023-03-04 DIAGNOSIS — M7552 Bursitis of left shoulder: Secondary | ICD-10-CM

## 2023-03-04 NOTE — Progress Notes (Signed)
Subjective:    Patient ID: Donald Estrada, male    DOB: 01-23-1981, 42 y.o.   MRN: [id number]  Shoulder Pain        Patient has a history of dyslipidemia/hypertriglyceridemia.  He is currently on fenofibrate as well as Vascepa twice daily.  He denies any chest pain shortness of breath or dyspnea on exertion.  He is long overdue to check his triglycerides and his cholesterol.  He also has a history of gout however he has not had a gout attack in several years since starting allopurinol.  He does complain of left shoulder pain today.  He has pain with abduction greater than 90 degrees however he has a negative empty can sign, he has negative Hawkins sign, he has a negative drop test.  There is no significant crepitus on examination.  I suspect bursitis in that shoulder.  He denies any injuries or falls. Past Medical History:  Diagnosis Date   Allergy    seasonal   Elevated LFTs    Gout    Hypertriglyceridemia    Prediabetes    Past Surgical History:  Procedure Laterality Date   CYSTOSCOPY W/ RETROGRADES Right 11/14/2019   Procedure: CYSTOSCOPY WITH RETROGRADE PYELOGRAM;  Surgeon: Riki Altes, MD;  Location: [facility];  Service: Urology;  Laterality: Right;   CYSTOSCOPY/URETEROSCOPY/HOLMIUM LASER/STENT PLACEMENT Right 11/14/2019   Procedure: CYSTOSCOPY/URETEROSCOPY/HOLMIUM LASER/STENT PLACEMENT;  Surgeon: Riki Altes, MD;  Location: [facility];  Service: Urology;  Laterality: Right;   EXTRACORPOREAL SHOCK WAVE LITHOTRIPSY Right 11/23/2019   Procedure: EXTRACORPOREAL SHOCK WAVE LITHOTRIPSY (ESWL);  Surgeon: Sondra Come, MD;  Location: [facility];  Service: Urology;  Laterality: Right;   INGUINAL HERNIA REPAIR     right   Current Outpatient Medications on File Prior to Visit  Medication Sig Dispense Refill   allopurinol (ZYLOPRIM) 300 MG tablet TAKE 1 TABLET BY MOUTH EVERY DAY 90 tablet 1   ALPRAZolam (XANAX) 0.5 MG tablet Take 1 tablet (0.5 mg total) by mouth 3 (three) times  daily as needed. 30 tablet 0   Colchicine 0.6 MG CAPS Take 0.6 mg by mouth daily. 30 capsule 1   escitalopram (LEXAPRO) 10 MG tablet TAKE 1 TABLET BY MOUTH EVERY DAY 90 tablet 1   fenofibrate 160 MG tablet TAKE 1 TABLET BY MOUTH EVERY DAY 90 tablet 0   icosapent Ethyl (VASCEPA) 1 g capsule Take 2 capsules (2 g total) by mouth 2 (two) times daily. 120 capsule 11   omeprazole (PRILOSEC) 20 MG capsule Take 20 mg by mouth daily as needed (heartburn and indigestion.).      sildenafil (VIAGRA) 100 MG tablet Take 1 tablet (100 mg total) by mouth daily as needed for erectile dysfunction. 30 tablet 3   tamsulosin (FLOMAX) 0.4 MG CAPS capsule Take 1 capsule (0.4 mg total) by mouth daily. 90 capsule 1   No current facility-administered medications on file prior to visit.   No Known Allergies Social History   Socioeconomic History   Marital status: Married    Spouse name: Not on file   Number of children: Not on file   Years of education: Not on file   Highest education level: 12th grade  Occupational History   Not on file  Tobacco Use   Smoking status: Never   Smokeless tobacco: Current    Types: Chew, Snuff  Substance and Sexual Activity   Alcohol use: Yes    Comment: rarely drinks   Drug use: No   Sexual activity: Yes  Birth control/protection: Surgical  Other Topics Concern   Not on file  Social History Narrative   Not on file   Social Determinants of Health   Financial Resource Strain: Low Risk  (03/03/2023)   Overall Financial Resource Strain (CARDIA)    Difficulty of Paying Living Expenses: Not hard at all  Food Insecurity: No Food Insecurity (03/03/2023)   Hunger Vital Sign    Worried About Running Out of Food in the Last Year: Never true    Ran Out of Food in the Last Year: Never true  Transportation Needs: No Transportation Needs (03/03/2023)   PRAPARE - Administrator, Civil Service (Medical): No    Lack of Transportation (Non-Medical): No  Physical  Activity: Insufficiently Active (03/03/2023)   Exercise Vital Sign    Days of Exercise per Week: 2 days    Minutes of Exercise per Session: 30 min  Stress: No Stress Concern Present (03/03/2023)   [employer] of Occupational Health - Occupational Stress Questionnaire    Feeling of Stress : Only a little  Social Connections: Moderately Integrated (03/03/2023)   Social Connection and Isolation Panel [NHANES]    Frequency of Communication with Friends and Family: Three times a week    Frequency of Social Gatherings with Friends and Family: Once a week    Attends Religious Services: More than 4 times per year    Active Member of [employer] or Organizations: No    Attends Engineer, structural: Not on file    Marital Status: Married  Catering manager Violence: Not on file      Review of Systems  All other systems reviewed and are negative.      Objective:   Physical Exam Vitals reviewed.  Constitutional:      General: He is not in acute distress.    Appearance: Normal appearance. He is well-developed and normal weight. He is not ill-appearing, toxic-appearing or diaphoretic.  HENT:     Head: Normocephalic and atraumatic.     Right Ear: Tympanic membrane, ear canal and external ear normal.     Left Ear: Tympanic membrane, ear canal and external ear normal.     Nose: Nose normal. No congestion or rhinorrhea.     Mouth/Throat:     Mouth: Mucous membranes are moist.     Pharynx: No oropharyngeal exudate or posterior oropharyngeal erythema.  Eyes:     General: No scleral icterus.       Right eye: No discharge.        Left eye: No discharge.     Conjunctiva/sclera: Conjunctivae normal.  Neck:     Thyroid: No thyromegaly.     Vascular: No carotid bruit or JVD.  Cardiovascular:     Rate and Rhythm: Normal rate and regular rhythm.     Pulses: Normal pulses.     Heart sounds: Normal heart sounds. No murmur heard.    No friction rub. No gallop.  Pulmonary:     Effort:  Pulmonary effort is normal. No respiratory distress.     Breath sounds: Normal breath sounds. No stridor. No wheezing or rales.  Chest:     Chest wall: No tenderness.  Abdominal:     General: Bowel sounds are normal. There is no distension.     Palpations: Abdomen is soft. There is no mass.     Tenderness: There is no abdominal tenderness. There is no guarding or rebound.  Musculoskeletal:        General: No swelling.  Left shoulder: Tenderness present. No swelling, effusion, bony tenderness or crepitus. Decreased range of motion. Normal strength.     Cervical back: Normal range of motion and neck supple. No tenderness.     Right lower leg: No edema.     Left lower leg: No edema.  Lymphadenopathy:     Cervical: No cervical adenopathy.  Skin:    Coloration: Skin is not jaundiced.     Findings: No bruising, erythema, lesion or rash.  Neurological:     General: No focal deficit present.     Mental Status: He is alert and oriented to person, place, and time. Mental status is at baseline.     Cranial Nerves: No cranial nerve deficit.     Sensory: No sensory deficit.     Motor: No weakness or abnormal muscle tone.     Coordination: Coordination normal.     Gait: Gait normal.     Deep Tendon Reflexes: Reflexes are normal and symmetric. Reflexes normal.  Psychiatric:        Mood and Affect: Mood normal.        Behavior: Behavior normal.        Thought Content: Thought content normal.        Judgment: Judgment normal.           Assessment & Plan:  Hypertriglyceridemia - Plan: CBC with Differential/Platelet, Lipid panel, COMPLETE METABOLIC PANEL WITH GFR  Chronic gout of foot, unspecified cause, unspecified laterality - Plan: Uric acid  Subacromial bursitis of left shoulder joint Patient has elevated triglycerides.  I will check a CBC a CMP and a fasting lipid panel.  I like to see his triglycerides below 200.  I like to see his HDL cholesterol greater than 40.  I would like to  see his uric acid level less than 6.  His blood pressure today is acceptable.  I suspect that he has subacromial bursitis.  Using sterile technique, I injected the subacromial space with 2 cc of lidocaine, 2 cc of Marcaine, and 2 cc of 40 mg/mL Kenalog.  The patient tolerated the procedure well without complication

## 2023-03-05 ENCOUNTER — Ambulatory Visit: Payer: BC Managed Care – PPO | Admitting: Family Medicine

## 2023-03-05 LAB — COMPLETE METABOLIC PANEL WITHOUT GFR
AG Ratio: 2 (calc) (ref 1.0–2.5)
ALT: 54 U/L — ABNORMAL HIGH (ref 9–46)
AST: 33 U/L (ref 10–40)
Albumin: 4.6 g/dL (ref 3.6–5.1)
Alkaline phosphatase (APISO): 54 U/L (ref 36–130)
BUN: 16 mg/dL (ref 7–25)
CO2: 28 mmol/L (ref 20–32)
Calcium: 10 mg/dL (ref 8.6–10.3)
Chloride: 104 mmol/L (ref 98–110)
Creat: 1.17 mg/dL (ref 0.60–1.29)
Globulin: 2.3 g/dL (ref 1.9–3.7)
Glucose, Bld: 88 mg/dL (ref 65–99)
Potassium: 3.8 mmol/L (ref 3.5–5.3)
Sodium: 139 mmol/L (ref 135–146)
Total Bilirubin: 0.5 mg/dL (ref 0.2–1.2)
Total Protein: 6.9 g/dL (ref 6.1–8.1)
eGFR: 80 mL/min/1.73m2

## 2023-03-05 LAB — CBC WITH DIFFERENTIAL/PLATELET
Absolute Monocytes: 511 cells/uL (ref 200–950)
Basophils Absolute: 58 cells/uL (ref 0–200)
Basophils Relative: 0.8 %
Eosinophils Absolute: 79 cells/uL (ref 15–500)
Eosinophils Relative: 1.1 %
HCT: 42.1 % (ref 38.5–50.0)
Hemoglobin: 14.8 g/dL (ref 13.2–17.1)
Lymphs Abs: 2599 cells/uL (ref 850–3900)
MCH: 32.5 pg (ref 27.0–33.0)
MCHC: 35.2 g/dL (ref 32.0–36.0)
MCV: 92.3 fL (ref 80.0–100.0)
MPV: 9.6 fL (ref 7.5–12.5)
Monocytes Relative: 7.1 %
Neutro Abs: 3953 cells/uL (ref 1500–7800)
Neutrophils Relative %: 54.9 %
Platelets: 299 10*3/uL (ref 140–400)
RBC: 4.56 10*6/uL (ref 4.20–5.80)
RDW: 12.3 % (ref 11.0–15.0)
Total Lymphocyte: 36.1 %
WBC: 7.2 10*3/uL (ref 3.8–10.8)

## 2023-03-05 LAB — LIPID PANEL
Cholesterol: 187 mg/dL (ref <200)
HDL: 28 mg/dL — ABNORMAL LOW (ref >=40)
Non-HDL Cholesterol (Calc): 159 mg/dL (calc) — ABNORMAL HIGH (ref <130)
Total CHOL/HDL Ratio: 6.7 (calc) — ABNORMAL HIGH (ref <5.0)
Triglycerides: 521 mg/dL — ABNORMAL HIGH (ref <150)

## 2023-03-05 LAB — URIC ACID: Uric Acid, Serum: 5.4 mg/dL (ref 4.0–8.0)

## 2023-03-08 ENCOUNTER — Other Ambulatory Visit: Payer: Self-pay | Admitting: Family Medicine

## 2023-03-08 DIAGNOSIS — E781 Pure hyperglyceridemia: Secondary | ICD-10-CM

## 2023-03-10 ENCOUNTER — Other Ambulatory Visit: Payer: BC Managed Care – PPO

## 2023-03-10 DIAGNOSIS — E781 Pure hyperglyceridemia: Secondary | ICD-10-CM

## 2023-03-10 LAB — LIPID PANEL
Cholesterol: 220 mg/dL — ABNORMAL HIGH (ref <200)
HDL: 40 mg/dL (ref >=40)
LDL Cholesterol (Calc): 129 mg/dL (calc) — ABNORMAL HIGH
Non-HDL Cholesterol (Calc): 180 mg/dL (calc) — ABNORMAL HIGH (ref <130)
Total CHOL/HDL Ratio: 5.5 (calc) — ABNORMAL HIGH (ref <5.0)
Triglycerides: 355 mg/dL — ABNORMAL HIGH (ref <150)

## 2023-03-12 ENCOUNTER — Other Ambulatory Visit: Payer: Self-pay

## 2023-03-12 MED ORDER — ATORVASTATIN CALCIUM 20 MG PO TABS: 20.0000 mg | ORAL_TABLET | Freq: Every day | ORAL | 3 refills | Status: DC

## 2023-03-19 MED ORDER — BUPIVACAINE HCL 0.25 % IJ SOLN
2.0000 mL | Freq: Once | INTRAMUSCULAR | Status: AC
Start: 2023-03-19 — End: 2023-03-19
  Administered 2023-03-19: 2 mL via INTRA_ARTICULAR

## 2023-03-19 MED ORDER — TRIAMCINOLONE ACETONIDE 40 MG/ML IJ SUSP
80.0000 mg | Freq: Once | INTRAMUSCULAR | Status: AC
Start: 2023-03-19 — End: 2023-03-19
  Administered 2023-03-19: 80 mg via INTRA_ARTICULAR

## 2023-03-19 MED ORDER — LIDOCAINE HCL (PF) 1 % IJ SOLN
2.0000 mL | Freq: Once | INTRAMUSCULAR | Status: AC
Start: 2023-03-19 — End: 2023-03-19
  Administered 2023-03-19: 2 mL

## 2023-03-19 NOTE — Addendum Note (Signed)
Addended by: Venia Carbon K on: 03/19/2023 05:14 PM   Modules accepted: Orders

## 2023-03-27 DIAGNOSIS — G4733 Obstructive sleep apnea (adult) (pediatric): Secondary | ICD-10-CM | POA: Diagnosis not present

## 2023-03-29 ENCOUNTER — Telehealth: Payer: Self-pay

## 2023-03-29 NOTE — Telephone Encounter (Signed)
PA-Icosapent Ethyl sent to [facility] 03/29/2023

## 2023-04-11 ENCOUNTER — Encounter: Payer: Self-pay | Admitting: Family Medicine

## 2023-04-27 DIAGNOSIS — G4733 Obstructive sleep apnea (adult) (pediatric): Secondary | ICD-10-CM | POA: Diagnosis not present

## 2023-05-21 ENCOUNTER — Other Ambulatory Visit: Payer: Self-pay | Admitting: Family Medicine

## 2023-05-21 NOTE — Telephone Encounter (Signed)
Requested Prescriptions  Pending Prescriptions Disp Refills   escitalopram (LEXAPRO) 10 MG tablet [Pharmacy Med Name: ESCITALOPRAM 10 MG TABLET] 90 tablet 0    Sig: TAKE 1 TABLET BY MOUTH EVERY DAY     Psychiatry:  Antidepressants - SSRI Failed - 05/21/2023  1:36 AM      Failed - Valid encounter within last 6 months    Recent Outpatient Visits           1 year ago Hypogonadism in male   [facility] [facility] Pickard, Priscille Heidelberg, MD   1 year ago Chronic gout of foot, unspecified cause, unspecified laterality   [facility] Valentino Nose, NP   3 years ago Hypogonadism in male   [facility] Pickard, Priscille Heidelberg, MD   4 years ago Hypogonadism in male   [facility] Pickard, Priscille Heidelberg, MD   5 years ago Hypogonadism in male   [facility] Pickard, Priscille Heidelberg, MD

## 2023-06-03 DIAGNOSIS — G4733 Obstructive sleep apnea (adult) (pediatric): Secondary | ICD-10-CM | POA: Diagnosis not present

## 2023-06-11 ENCOUNTER — Other Ambulatory Visit: Payer: BC Managed Care – PPO

## 2023-06-11 DIAGNOSIS — E781 Pure hyperglyceridemia: Secondary | ICD-10-CM

## 2023-06-12 LAB — LIPID PANEL
Cholesterol: 150 mg/dL (ref <200)
HDL: 30 mg/dL — ABNORMAL LOW (ref >=40)
LDL Cholesterol (Calc): 91 mg/dL (calc)
Non-HDL Cholesterol (Calc): 120 mg/dL (calc) (ref <130)
Total CHOL/HDL Ratio: 5 (calc) — ABNORMAL HIGH (ref <5.0)
Triglycerides: 194 mg/dL — ABNORMAL HIGH (ref <150)

## 2023-06-12 LAB — EXTRA LAV TOP TUBE

## 2023-07-03 DIAGNOSIS — G4733 Obstructive sleep apnea (adult) (pediatric): Secondary | ICD-10-CM | POA: Diagnosis not present

## 2023-07-07 ENCOUNTER — Telehealth: Payer: Self-pay | Admitting: Family Medicine

## 2023-07-07 ENCOUNTER — Other Ambulatory Visit: Payer: Self-pay

## 2023-07-07 DIAGNOSIS — M1A0790 Idiopathic chronic gout, unspecified ankle and foot, without tophus (tophi): Secondary | ICD-10-CM

## 2023-07-07 MED ORDER — ALLOPURINOL 300 MG PO TABS: 300.0000 mg | ORAL_TABLET | Freq: Every day | ORAL | 1 refills | Status: DC

## 2023-07-07 NOTE — Telephone Encounter (Signed)
Prescription Request  07/07/2023  LOV: 03/04/2023  What is the name of the medication or equipment?   allopurinol (ZYLOPRIM) 300 MG tablet  **90 day script requested**  Have you contacted your pharmacy to request a refill? Yes   Which pharmacy would you like this sent to?  [id number] [place], [place] - [address] [place] [address] Phone: [phone] Fax: [phone]    Patient notified that their request is being sent to the clinical staff for review and that they should receive a response within 2 business days.   Please advise pharmacist.

## 2023-07-27 ENCOUNTER — Ambulatory Visit: Payer: BC Managed Care – PPO | Admitting: Family Medicine

## 2023-07-27 VITALS — BP 128/76 | HR 76 | Temp 98.6°F | Ht 70.0 in | Wt 217.0 lb

## 2023-07-27 DIAGNOSIS — R55 Syncope and collapse: Secondary | ICD-10-CM | POA: Diagnosis not present

## 2023-07-27 DIAGNOSIS — Z23 Encounter for immunization: Secondary | ICD-10-CM

## 2023-07-27 NOTE — Progress Notes (Signed)
Subjective:    Patient ID: Donald Estrada, male    DOB: 1981/03/02, 42 y.o.   MRN: [id number]     Patient is here today to discuss 2 episodes where he felt like he was going to pass out.  In both instances, the patient is that he was straining.  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  He was in an awkward position stretching and straining to reach a spot.  Essentially, the patient was performing a Valsalva maneuver unintentionally.  He broke out in a clammy sweat all over his body.  He immediately felt lightheaded like he was going to pass out.  His blood sugar was normal.  He states that his blood pressure was okay.  He states that his heart rate is normal.  He denies any irregular heart rates.  He denies any chest pain or shortness of breath associated with it.  Symptoms gradually past over few moments.  This event has happened twice.  Both instances involve similar episodes of straining or bearing down.  However aerobic exercise does not elicit any lightheadedness.  For instance he played kickball yesterday with no chest pain, no angina, no lightheadedness. Past Medical History:  Diagnosis Date   Allergy    seasonal   Elevated LFTs    Gout    Hypertriglyceridemia    Prediabetes    Past Surgical History:  Procedure Laterality Date   CYSTOSCOPY W/ RETROGRADES Right 11/14/2019   Procedure: CYSTOSCOPY WITH RETROGRADE PYELOGRAM;  Surgeon: Riki Altes, MD;  Location: [facility];  Service: Urology;  Laterality: Right;   CYSTOSCOPY/URETEROSCOPY/HOLMIUM LASER/STENT PLACEMENT Right 11/14/2019   Procedure: CYSTOSCOPY/URETEROSCOPY/HOLMIUM LASER/STENT PLACEMENT;  Surgeon: Riki Altes, MD;  Location: [facility];  Service: Urology;  Laterality: Right;   EXTRACORPOREAL SHOCK WAVE LITHOTRIPSY Right 11/23/2019   Procedure: EXTRACORPOREAL SHOCK WAVE LITHOTRIPSY (ESWL);  Surgeon: Sondra Come, MD;  Location: [facility];  Service: Urology;  Laterality: Right;   INGUINAL HERNIA REPAIR     right    Current Outpatient Medications on File Prior to Visit  Medication Sig Dispense Refill   allopurinol (ZYLOPRIM) 300 MG tablet Take 1 tablet (300 mg total) by mouth daily. 90 tablet 1   ALPRAZolam (XANAX) 0.5 MG tablet Take 1 tablet (0.5 mg total) by mouth 3 (three) times daily as needed. 30 tablet 0   atorvastatin (LIPITOR) 20 MG tablet Take 1 tablet (20 mg total) by mouth daily. 90 tablet 3   Colchicine 0.6 MG CAPS Take 0.6 mg by mouth daily. 30 capsule 1   escitalopram (LEXAPRO) 10 MG tablet TAKE 1 TABLET BY MOUTH EVERY DAY 90 tablet 0   fenofibrate 160 MG tablet TAKE 1 TABLET BY MOUTH EVERY DAY 90 tablet 1   icosapent Ethyl (VASCEPA) 1 g capsule Take 2 capsules (2 g total) by mouth 2 (two) times daily. 120 capsule 11   omeprazole (PRILOSEC) 20 MG capsule Take 20 mg by mouth daily as needed (heartburn and indigestion.).      sildenafil (VIAGRA) 100 MG tablet Take 1 tablet (100 mg total) by mouth daily as needed for erectile dysfunction. 30 tablet 3   tamsulosin (FLOMAX) 0.4 MG CAPS capsule Take 1 capsule (0.4 mg total) by mouth daily. 90 capsule 1   No current facility-administered medications on file prior to visit.   No Known Allergies Social History   Socioeconomic History   Marital status: Married    Spouse name: Not on file   Number of children: Not on file  Years of education: Not on file   Highest education level: GED or equivalent  Occupational History   Not on file  Tobacco Use   Smoking status: Never   Smokeless tobacco: Current    Types: Chew, Snuff  Substance and Sexual Activity   Alcohol use: Yes    Comment: rarely drinks   Drug use: No   Sexual activity: Yes    Birth control/protection: Surgical  Other Topics Concern   Not on file  Social History Narrative   Not on file   Social Determinants of Health   Financial Resource Strain: Low Risk  (07/27/2023)   Overall Financial Resource Strain (CARDIA)    Difficulty of Paying Living Expenses: Not very hard   Food Insecurity: No Food Insecurity (07/27/2023)   Hunger Vital Sign    Worried About Running Out of Food in the Last Year: Never true    Ran Out of Food in the Last Year: Never true  Transportation Needs: No Transportation Needs (07/27/2023)   PRAPARE - Administrator, Civil Service (Medical): No    Lack of Transportation (Non-Medical): No  Physical Activity: Insufficiently Active (07/27/2023)   Exercise Vital Sign    Days of Exercise per Week: 2 days    Minutes of Exercise per Session: 30 min  Stress: No Stress Concern Present (07/27/2023)   [employer] of Occupational Health - Occupational Stress Questionnaire    Feeling of Stress : Only a little  Social Connections: Moderately Integrated (07/27/2023)   Social Connection and Isolation Panel [NHANES]    Frequency of Communication with Friends and Family: More than three times a week    Frequency of Social Gatherings with Friends and Family: More than three times a week    Attends Religious Services: 1 to 4 times per year    Active Member of [employer] or Organizations: No    Attends Engineer, structural: Not on file    Marital Status: Married  Catering manager Violence: Not on file      Review of Systems  All other systems reviewed and are negative.      Objective:   Physical Exam Vitals reviewed.  Constitutional:      General: He is not in acute distress.    Appearance: Normal appearance. He is well-developed and normal weight. He is not ill-appearing, toxic-appearing or diaphoretic.  HENT:     Head: Normocephalic and atraumatic.     Right Ear: External ear normal.     Left Ear: External ear normal.     Nose: Nose normal. No congestion or rhinorrhea.     Mouth/Throat:     Mouth: Mucous membranes are moist.     Pharynx: No oropharyngeal exudate or posterior oropharyngeal erythema.  Eyes:     General: No scleral icterus.       Right eye: No discharge.        Left eye: No discharge.      Conjunctiva/sclera: Conjunctivae normal.  Neck:     Thyroid: No thyromegaly.     Vascular: No carotid bruit or JVD.  Cardiovascular:     Rate and Rhythm: Normal rate and regular rhythm.     Pulses: Normal pulses.     Heart sounds: Normal heart sounds. No murmur heard.    No friction rub. No gallop.  Pulmonary:     Effort: Pulmonary effort is normal. No respiratory distress.     Breath sounds: Normal breath sounds. No stridor. No wheezing or rales.  Chest:     Chest wall: No tenderness.  Abdominal:     General: Bowel sounds are normal. There is no distension.     Palpations: Abdomen is soft. There is no mass.     Tenderness: There is no abdominal tenderness. There is no guarding or rebound.  Musculoskeletal:        General: No swelling.     Cervical back: Normal range of motion and neck supple. No tenderness.     Right lower leg: No edema.     Left lower leg: No edema.  Lymphadenopathy:     Cervical: No cervical adenopathy.  Skin:    Coloration: Skin is not jaundiced.     Findings: No bruising, erythema, lesion or rash.  Neurological:     General: No focal deficit present.     Mental Status: He is alert and oriented to person, place, and time. Mental status is at baseline.     Cranial Nerves: No cranial nerve deficit.     Sensory: No sensory deficit.     Motor: No weakness or abnormal muscle tone.     Coordination: Coordination normal.     Gait: Gait normal.     Deep Tendon Reflexes: Reflexes are normal and symmetric. Reflexes normal.  Psychiatric:        Mood and Affect: Mood normal.        Behavior: Behavior normal.        Thought Content: Thought content normal.        Judgment: Judgment normal.           Assessment & Plan:  Near syncope I believe the patient has had 2 near syncopal episodes there were triggered due to Valsalva maneuver causing a vasovagal reaction.  Patient denies any chest pain or shortness of breath or angina.  He denies any arrhythmias.  We  discussed getting an echocardiogram and a cardiac monitor.  The patient would like to discuss the situation with his wife prior to proceeding with any additional testing.

## 2023-07-27 NOTE — Addendum Note (Signed)
Addended by: Renee Pain on: 07/27/2023 04:11 PM   Modules accepted: Orders

## 2023-08-03 DIAGNOSIS — G4733 Obstructive sleep apnea (adult) (pediatric): Secondary | ICD-10-CM | POA: Diagnosis not present

## 2023-08-16 ENCOUNTER — Telehealth: Payer: Self-pay

## 2023-08-16 ENCOUNTER — Other Ambulatory Visit: Payer: Self-pay | Admitting: Family Medicine

## 2023-08-16 MED ORDER — ESCITALOPRAM OXALATE 10 MG PO TABS: 10.0000 mg | ORAL_TABLET | Freq: Every day | ORAL | 3 refills | Status: DC

## 2023-08-16 NOTE — Telephone Encounter (Signed)
Prescription Request  08/16/2023  LOV: 03/05/23 What is the name of the medication or equipment? escitalopram (LEXAPRO) 10 MG tablet [[id number]]   Have you contacted your pharmacy to request a refill? Yes   Which pharmacy would you like this sent to?  [id number] [place], [place] - [address] [place] [address] Phone: [phone] Fax: [phone]    Patient notified that their request is being sent to the clinical staff for review and that they should receive a response within 2 business days.   Please advise at [facility] [phone]

## 2023-09-01 DIAGNOSIS — G4733 Obstructive sleep apnea (adult) (pediatric): Secondary | ICD-10-CM | POA: Diagnosis not present

## 2023-09-05 ENCOUNTER — Other Ambulatory Visit: Payer: Self-pay | Admitting: Family Medicine

## 2023-09-05 DIAGNOSIS — E781 Pure hyperglyceridemia: Secondary | ICD-10-CM

## 2023-09-08 DIAGNOSIS — G4733 Obstructive sleep apnea (adult) (pediatric): Secondary | ICD-10-CM | POA: Diagnosis not present

## 2023-10-02 DIAGNOSIS — G4733 Obstructive sleep apnea (adult) (pediatric): Secondary | ICD-10-CM | POA: Diagnosis not present

## 2023-11-02 DIAGNOSIS — G4733 Obstructive sleep apnea (adult) (pediatric): Secondary | ICD-10-CM | POA: Diagnosis not present

## 2023-12-18 DIAGNOSIS — G4733 Obstructive sleep apnea (adult) (pediatric): Secondary | ICD-10-CM | POA: Diagnosis not present

## 2024-01-04 ENCOUNTER — Other Ambulatory Visit: Payer: Self-pay | Admitting: Family Medicine

## 2024-01-04 DIAGNOSIS — M1A0790 Idiopathic chronic gout, unspecified ankle and foot, without tophus (tophi): Secondary | ICD-10-CM

## 2024-01-04 NOTE — Telephone Encounter (Signed)
 Patient will need an office visit for additional refills. Requested Prescriptions  Pending Prescriptions Disp Refills   allopurinol (ZYLOPRIM) 300 MG tablet [Pharmacy Med Name: ALLOPURINOL 300 MG TABLET] 90 tablet 0    Sig: TAKE 1 TABLET BY MOUTH EVERY DAY     Endocrinology:  Gout Agents - allopurinol Passed - 01/04/2024  5:05 PM      Passed - Uric Acid in normal range and within 360 days    Uric Acid, Serum  Date Value Ref Range Status  03/04/2023 5.4 4.0 - 8.0 mg/dL Final    Comment:    Therapeutic target for gout patients: <6.0 mg/dL .          Passed - Cr in normal range and within 360 days    Creat  Date Value Ref Range Status  03/04/2023 1.17 0.60 - 1.29 mg/dL Final         Passed - Valid encounter within last 12 months    Recent Outpatient Visits           5 months ago Near syncope   [place] [facility] Pickard, Cisco Crest, MD   10 months ago Hypertriglyceridemia   [place] [facility] Pickard, Cisco Crest, MD              Passed - CBC within normal limits and completed in the last 12 months    WBC  Date Value Ref Range Status  03/04/2023 7.2 3.8 - 10.8 Thousand/uL Final   RBC  Date Value Ref Range Status  03/04/2023 4.56 4.20 - 5.80 Million/uL Final   Hemoglobin  Date Value Ref Range Status  03/04/2023 14.8 13.2 - 17.1 g/dL Final   HCT  Date Value Ref Range Status  03/04/2023 42.1 38.5 - 50.0 % Final   MCHC  Date Value Ref Range Status  03/04/2023 35.2 32.0 - 36.0 g/dL Final   [facility]  Date Value Ref Range Status  03/04/2023 32.5 27.0 - 33.0 pg Final   MCV  Date Value Ref Range Status  03/04/2023 92.3 80.0 - 100.0 fL Final   No results found for: "PLTCOUNTKUC", "LABPLAT", "POCPLA" RDW  Date Value Ref Range Status  03/04/2023 12.3 11.0 - 15.0 % Final

## 2024-01-07 ENCOUNTER — Other Ambulatory Visit: Payer: Self-pay | Admitting: Family Medicine

## 2024-01-07 DIAGNOSIS — E781 Pure hyperglyceridemia: Secondary | ICD-10-CM

## 2024-01-07 NOTE — Telephone Encounter (Signed)
 Prescription Request  01/07/2024  LOV: 07/27/2023  What is the name of the medication or equipment? fenofibrate  160 MG tablet   Have you contacted your pharmacy to request a refill? Yes   Which pharmacy would you like this sent to?  [id number] [place], [place] - [address] [place] [address] Phone: [phone] Fax: [phone]    Patient notified that their request is being sent to the clinical staff for review and that they should receive a response within 2 business days.   Please advise at [facility] [phone]

## 2024-01-07 NOTE — Telephone Encounter (Signed)
 Too soon for refill, last refill 09/06/23 for 90 and 1 refill.  Requested Prescriptions  Pending Prescriptions Disp Refills   fenofibrate  160 MG tablet 90 tablet 1    Sig: Take 1 tablet (160 mg total) by mouth daily.     Cardiovascular:  Antilipid - Fibric Acid Derivatives Failed - 01/07/2024  2:41 PM      Failed - ALT in normal range and within 360 days    ALT  Date Value Ref Range Status  03/04/2023 54 (H) 9 - 46 U/L Final         Failed - Lipid Panel in normal range within the last 12 months    Cholesterol  Date Value Ref Range Status  06/11/2023 150 <200 mg/dL Final   LDL Cholesterol (Calc)  Date Value Ref Range Status  06/11/2023 91 mg/dL (calc) Final    Comment:    Reference range: <100 . Desirable range <100 mg/dL for primary prevention;   <70 mg/dL for patients with CHD or diabetic patients  with > or = 2 CHD risk factors. Aaron Aas LDL-C is now calculated using the Martin-Hopkins  calculation, which is a validated novel method providing  better accuracy than the Friedewald equation in the  estimation of LDL-C.  Melinda Sprawls et al. Erroll Heard. 1478;295(62): 2061-2068  (http://education.[facility].com/faq/FAQ164)    HDL  Date Value Ref Range Status  06/11/2023 30 (L) > OR = 40 mg/dL Final   Triglycerides  Date Value Ref Range Status  06/11/2023 194 (H) <150 mg/dL Final         Passed - AST in normal range and within 360 days    AST  Date Value Ref Range Status  03/04/2023 33 10 - 40 U/L Final         Passed - Cr in normal range and within 360 days    Creat  Date Value Ref Range Status  03/04/2023 1.17 0.60 - 1.29 mg/dL Final         Passed - HGB in normal range and within 360 days    Hemoglobin  Date Value Ref Range Status  03/04/2023 14.8 13.2 - 17.1 g/dL Final         Passed - HCT in normal range and within 360 days    HCT  Date Value Ref Range Status  03/04/2023 42.1 38.5 - 50.0 % Final         Passed - PLT in normal range and within 360 days     Platelets  Date Value Ref Range Status  03/04/2023 299 140 - 400 Thousand/uL Final         Passed - WBC in normal range and within 360 days    WBC  Date Value Ref Range Status  03/04/2023 7.2 3.8 - 10.8 Thousand/uL Final         Passed - eGFR is 30 or above and within 360 days    GFR, Est African American  Date Value Ref Range Status  04/29/2020 77 > OR = 60 mL/min/1.52m2 Final   GFR, Est Non African American  Date Value Ref Range Status  04/29/2020 67 > OR = 60 mL/min/1.29m2 Final   eGFR  Date Value Ref Range Status  03/04/2023 80 > OR = 60 mL/min/1.32m2 Final         Passed - Valid encounter within last 12 months    Recent Outpatient Visits           5 months ago Near syncope   [place] [facility] Pickard, Cisco Crest, MD   10 months ago Hypertriglyceridemia   [place] [facility] Pickard, Cisco Crest, MD

## 2024-01-07 NOTE — Telephone Encounter (Signed)
 Should have 1 refill on file- added 90 days to last until labwork is due  Requested Prescriptions  Pending Prescriptions Disp Refills   atorvastatin  (LIPITOR) 20 MG tablet [Pharmacy Med Name: ATORVASTATIN  20 MG TABLET] 90 tablet 0    Sig: TAKE 1 TABLET BY MOUTH EVERY DAY     Cardiovascular:  Antilipid - Statins Failed - 01/07/2024 12:23 PM      Failed - Lipid Panel in normal range within the last 12 months    Cholesterol  Date Value Ref Range Status  06/11/2023 150 <200 mg/dL Final   LDL Cholesterol (Calc)  Date Value Ref Range Status  06/11/2023 91 mg/dL (calc) Final    Comment:    Reference range: <100 . Desirable range <100 mg/dL for primary prevention;   <70 mg/dL for patients with CHD or diabetic patients  with > or = 2 CHD risk factors. Aaron Aas LDL-C is now calculated using the Martin-Hopkins  calculation, which is a validated novel method providing  better accuracy than the Friedewald equation in the  estimation of LDL-C.  Melinda Sprawls et al. Erroll Heard. 0981;191(47): 2061-2068  (http://education.[facility].com/faq/FAQ164)    HDL  Date Value Ref Range Status  06/11/2023 30 (L) > OR = 40 mg/dL Final   Triglycerides  Date Value Ref Range Status  06/11/2023 194 (H) <150 mg/dL Final         Passed - Patient is not pregnant      Passed - Valid encounter within last 12 months    Recent Outpatient Visits           5 months ago Near syncope   [facility] Pickard, Cisco Crest, MD   10 months ago Hypertriglyceridemia   [facility] Pickard, Cisco Crest, MD

## 2024-01-11 DIAGNOSIS — L720 Epidermal cyst: Secondary | ICD-10-CM | POA: Diagnosis not present

## 2024-01-11 DIAGNOSIS — D485 Neoplasm of uncertain behavior of skin: Secondary | ICD-10-CM | POA: Diagnosis not present

## 2024-01-18 DIAGNOSIS — G4733 Obstructive sleep apnea (adult) (pediatric): Secondary | ICD-10-CM | POA: Diagnosis not present

## 2024-01-27 ENCOUNTER — Other Ambulatory Visit: Payer: Self-pay | Admitting: Family Medicine

## 2024-01-27 DIAGNOSIS — M1A0790 Idiopathic chronic gout, unspecified ankle and foot, without tophus (tophi): Secondary | ICD-10-CM

## 2024-01-28 ENCOUNTER — Other Ambulatory Visit: Payer: Self-pay

## 2024-01-28 DIAGNOSIS — E781 Pure hyperglyceridemia: Secondary | ICD-10-CM

## 2024-01-28 NOTE — Telephone Encounter (Signed)
 Prescription Request  01/28/2024  LOV: 07/27/23  What is the name of the medication or equipment? fenofibrate  160 MG tablet [[id number]]   Have you contacted your pharmacy to request a refill? Yes   Which pharmacy would you like this sent to?  [id number] [place]ette [place]le, [place] - [address] [place] [address] Phone: [phone] Fax: [phone]    Patient notified that their request is being sent to the clinical staff for review and that they should receive a response within 2 business days.   Please advise at [facility] [phone]

## 2024-01-31 MED ORDER — FENOFIBRATE 160 MG PO TABS: 160.0000 mg | ORAL_TABLET | Freq: Every day | ORAL | 0 refills | Status: DC

## 2024-01-31 NOTE — Telephone Encounter (Signed)
 Requested Prescriptions  Pending Prescriptions Disp Refills   fenofibrate  160 MG tablet 90 tablet 0    Sig: Take 1 tablet (160 mg total) by mouth daily.     Cardiovascular:  Antilipid - Fibric Acid Derivatives Failed - 01/31/2024  1:02 PM      Failed - ALT in normal range and within 360 days    ALT  Date Value Ref Range Status  03/04/2023 54 (H) 9 - 46 U/L Final         Failed - Lipid Panel in normal range within the last 12 months    Cholesterol  Date Value Ref Range Status  06/11/2023 150 <200 mg/dL Final   LDL Cholesterol (Calc)  Date Value Ref Range Status  06/11/2023 91 mg/dL (calc) Final    Comment:    Reference range: <100 . Desirable range <100 mg/dL for primary prevention;   <70 mg/dL for patients with CHD or diabetic patients  with > or = 2 CHD risk factors. Aaron Aas LDL-C is now calculated using the Martin-Hopkins  calculation, which is a validated novel method providing  better accuracy than the Friedewald equation in the  estimation of LDL-C.  Melinda Sprawls et al. Erroll Heard. 1610;960(45): 2061-2068  (http://education.[facility].com/faq/FAQ164)    HDL  Date Value Ref Range Status  06/11/2023 30 (L) > OR = 40 mg/dL Final   Triglycerides  Date Value Ref Range Status  06/11/2023 194 (H) <150 mg/dL Final         Passed - AST in normal range and within 360 days    AST  Date Value Ref Range Status  03/04/2023 33 10 - 40 U/L Final         Passed - Cr in normal range and within 360 days    Creat  Date Value Ref Range Status  03/04/2023 1.17 0.60 - 1.29 mg/dL Final         Passed - HGB in normal range and within 360 days    Hemoglobin  Date Value Ref Range Status  03/04/2023 14.8 13.2 - 17.1 g/dL Final         Passed - HCT in normal range and within 360 days    HCT  Date Value Ref Range Status  03/04/2023 42.1 38.5 - 50.0 % Final         Passed - PLT in normal range and within 360 days    Platelets  Date Value Ref Range Status  03/04/2023 299 140 - 400  Thousand/uL Final         Passed - WBC in normal range and within 360 days    WBC  Date Value Ref Range Status  03/04/2023 7.2 3.8 - 10.8 Thousand/uL Final         Passed - eGFR is 30 or above and within 360 days    GFR, Est African American  Date Value Ref Range Status  04/29/2020 77 > OR = 60 mL/min/1.77m2 Final   GFR, Est Non African American  Date Value Ref Range Status  04/29/2020 67 > OR = 60 mL/min/1.41m2 Final   eGFR  Date Value Ref Range Status  03/04/2023 80 > OR = 60 mL/min/1.81m2 Final         Passed - Valid encounter within last 12 months    Recent Outpatient Visits           6 months ago Near syncope   [facility] Pickard, Cisco Crest, MD   11 months ago Hypertriglyceridemia  [facility] Pickard, Cisco Crest, MD

## 2024-02-17 DIAGNOSIS — G4733 Obstructive sleep apnea (adult) (pediatric): Secondary | ICD-10-CM | POA: Diagnosis not present

## 2024-04-11 ENCOUNTER — Other Ambulatory Visit: Payer: Self-pay | Admitting: Family Medicine

## 2024-04-11 DIAGNOSIS — M1A0790 Idiopathic chronic gout, unspecified ankle and foot, without tophus (tophi): Secondary | ICD-10-CM

## 2024-04-25 ENCOUNTER — Other Ambulatory Visit: Payer: Self-pay | Admitting: Family Medicine

## 2024-04-25 DIAGNOSIS — E781 Pure hyperglyceridemia: Secondary | ICD-10-CM

## 2024-05-03 DIAGNOSIS — G4733 Obstructive sleep apnea (adult) (pediatric): Secondary | ICD-10-CM | POA: Diagnosis not present

## 2024-05-09 ENCOUNTER — Encounter: Payer: Self-pay | Admitting: Family Medicine

## 2024-05-10 ENCOUNTER — Other Ambulatory Visit: Payer: Self-pay | Admitting: Family Medicine

## 2024-05-10 ENCOUNTER — Other Ambulatory Visit: Payer: Self-pay

## 2024-05-10 DIAGNOSIS — E781 Pure hyperglyceridemia: Secondary | ICD-10-CM

## 2024-05-10 MED ORDER — FENOFIBRATE 160 MG PO TABS: 160.0000 mg | ORAL_TABLET | Freq: Every day | ORAL | 0 refills | Status: DC

## 2024-05-11 NOTE — Telephone Encounter (Signed)
 Requested Prescriptions  Refused Prescriptions Disp Refills   fenofibrate  160 MG tablet [Pharmacy Med Name: FENOFIBRATE  160 MG TABLET] 90 tablet     Sig: TAKE 1 TABLET BY MOUTH EVERY DAY     Cardiovascular:  Antilipid - Fibric Acid Derivatives Failed - 05/11/2024  2:52 PM      Failed - ALT in normal range and within 360 days    ALT  Date Value Ref Range Status  03/04/2023 54 (H) 9 - 46 U/L Final         Failed - AST in normal range and within 360 days    AST  Date Value Ref Range Status  03/04/2023 33 10 - 40 U/L Final         Failed - Cr in normal range and within 360 days    Creat  Date Value Ref Range Status  03/04/2023 1.17 0.60 - 1.29 mg/dL Final         Failed - HGB in normal range and within 360 days    Hemoglobin  Date Value Ref Range Status  03/04/2023 14.8 13.2 - 17.1 g/dL Final         Failed - HCT in normal range and within 360 days    HCT  Date Value Ref Range Status  03/04/2023 42.1 38.5 - 50.0 % Final         Failed - PLT in normal range and within 360 days    Platelets  Date Value Ref Range Status  03/04/2023 299 140 - 400 Thousand/uL Final         Failed - WBC in normal range and within 360 days    WBC  Date Value Ref Range Status  03/04/2023 7.2 3.8 - 10.8 Thousand/uL Final         Failed - eGFR is 30 or above and within 360 days    GFR, Est African American  Date Value Ref Range Status  04/29/2020 77 > OR = 60 mL/min/1.34m2 Final   GFR, Est Non African American  Date Value Ref Range Status  04/29/2020 67 > OR = 60 mL/min/1.45m2 Final   eGFR  Date Value Ref Range Status  03/04/2023 80 > OR = 60 mL/min/1.71m2 Final         Failed - Lipid Panel in normal range within the last 12 months    Cholesterol  Date Value Ref Range Status  06/11/2023 150 <200 mg/dL Final   LDL Cholesterol (Calc)  Date Value Ref Range Status  06/11/2023 91 mg/dL (calc) Final    Comment:    Reference range: <100 . Desirable range <100 mg/dL for primary  prevention;   <70 mg/dL for patients with CHD or diabetic patients  with > or = 2 CHD risk factors. SABRA LDL-C is now calculated using the Martin-Hopkins  calculation, which is a validated novel method providing  better accuracy than the Friedewald equation in the  estimation of LDL-C.  Gladis APPLETHWAITE et al. SANDREA. 7986;689(80): 2061-2068  (http://education.[facility].com/faq/FAQ164)    HDL  Date Value Ref Range Status  06/11/2023 30 (L) > OR = 40 mg/dL Final   Triglycerides  Date Value Ref Range Status  06/11/2023 194 (H) <150 mg/dL Final         Passed - Valid encounter within last 12 months    Recent Outpatient Visits           9 months ago Near syncope   [facility] Pickard, Butler DASEN, MD  1 year ago Hypertriglyceridemia   [facility] Pickard, Butler DASEN, MD

## 2024-05-17 ENCOUNTER — Telehealth: Payer: Self-pay

## 2024-05-17 NOTE — Telephone Encounter (Signed)
 Copied from CRM [id number]. Topic: Clinical - Request for Lab/Test Order >> May 16, 2024  3:30 PM Carlatta H wrote: Reason for CRM: Patient is requesting labs form 9/5 appointment

## 2024-05-26 ENCOUNTER — Ambulatory Visit: Admitting: Family Medicine

## 2024-06-03 DIAGNOSIS — G4733 Obstructive sleep apnea (adult) (pediatric): Secondary | ICD-10-CM | POA: Diagnosis not present

## 2024-06-09 ENCOUNTER — Ambulatory Visit: Admitting: Family Medicine

## 2024-06-09 ENCOUNTER — Encounter: Payer: Self-pay | Admitting: Family Medicine

## 2024-06-09 VITALS — BP 126/76 | HR 64 | Temp 99.1°F | Ht 70.0 in | Wt 225.6 lb

## 2024-06-09 DIAGNOSIS — R0609 Other forms of dyspnea: Secondary | ICD-10-CM

## 2024-06-09 DIAGNOSIS — Z23 Encounter for immunization: Secondary | ICD-10-CM

## 2024-06-09 DIAGNOSIS — E781 Pure hyperglyceridemia: Secondary | ICD-10-CM | POA: Diagnosis not present

## 2024-06-09 MED ORDER — FENOFIBRATE 160 MG PO TABS: 160.0000 mg | ORAL_TABLET | Freq: Every day | ORAL | 1 refills | Status: AC

## 2024-06-09 NOTE — Addendum Note (Signed)
 Addended by: ANGELENA RONAL BRADLEY K on: 06/09/2024 09:36 AM   Modules accepted: Orders

## 2024-06-09 NOTE — Progress Notes (Signed)
 Subjective:    Patient ID: Donald Estrada, male    DOB: [birth year], 43 y.o.   MRN: [id number]  Patient is a very pleasant 43 year old Caucasian gentleman who has a history of dyslipidemia prediabetes and gout.  He is here today to recheck his lab work.  His blood pressure is excellent at 126/76.  Recently he has been noticing dyspnea on exertion.  He states that he can walk and work all day long without any chest pain or shortness of breath as long as he works at a slow steady pace.  However quick burst of energy make him extremely short of breath more than normal.  He denies any angina.  He denies any cough or wheezing or hemoptysis.  He denies any melena or hematochezia. Past Medical History:  Diagnosis Date   Allergy    seasonal   Elevated LFTs    Gout    Hypertriglyceridemia    Prediabetes    Past Surgical History:  Procedure Laterality Date   CYSTOSCOPY W/ RETROGRADES Right 11/14/2019   Procedure: CYSTOSCOPY WITH RETROGRADE PYELOGRAM;  Surgeon: Twylla Glendia BROCKS, MD;  Location: [facility];  Service: Urology;  Laterality: Right;   CYSTOSCOPY/URETEROSCOPY/HOLMIUM LASER/STENT PLACEMENT Right 11/14/2019   Procedure: CYSTOSCOPY/URETEROSCOPY/HOLMIUM LASER/STENT PLACEMENT;  Surgeon: Twylla Glendia BROCKS, MD;  Location: [facility];  Service: Urology;  Laterality: Right;   EXTRACORPOREAL SHOCK WAVE LITHOTRIPSY Right 11/23/2019   Procedure: EXTRACORPOREAL SHOCK WAVE LITHOTRIPSY (ESWL);  Surgeon: Francisca Redell BROCKS, MD;  Location: [facility];  Service: Urology;  Laterality: Right;   INGUINAL HERNIA REPAIR     right   Current Outpatient Medications on File Prior to Visit  Medication Sig Dispense Refill   allopurinol  (ZYLOPRIM ) 300 MG tablet TAKE 1 TABLET BY MOUTH EVERY DAY 90 tablet 0   ALPRAZolam  (XANAX ) 0.5 MG tablet Take 1 tablet (0.5 mg total) by mouth 3 (three) times daily as needed. 30 tablet 0   atorvastatin  (LIPITOR) 20 MG tablet TAKE 1 TABLET BY MOUTH EVERY DAY 90 tablet 0   Colchicine  0.6 MG CAPS  Take 0.6 mg by mouth daily. 30 capsule 1   escitalopram  (LEXAPRO ) 10 MG tablet Take 1 tablet (10 mg total) by mouth daily. 90 tablet 3   icosapent  Ethyl (VASCEPA ) 1 g capsule Take 2 capsules (2 g total) by mouth 2 (two) times daily. 120 capsule 11   omeprazole (PRILOSEC) 20 MG capsule Take 20 mg by mouth daily as needed (heartburn and indigestion.).      sildenafil  (VIAGRA ) 100 MG tablet Take 1 tablet (100 mg total) by mouth daily as needed for erectile dysfunction. 30 tablet 3   tamsulosin  (FLOMAX ) 0.4 MG CAPS capsule Take 1 capsule (0.4 mg total) by mouth daily. 90 capsule 1   No current facility-administered medications on file prior to visit.   No Known Allergies Social History   Socioeconomic History   Marital status: Married    Spouse name: Not on file   Number of children: Not on file   Years of education: Not on file   Highest education level: GED or equivalent  Occupational History   Not on file  Tobacco Use   Smoking status: Never   Smokeless tobacco: Current    Types: Chew, Snuff  Substance and Sexual Activity   Alcohol use: Yes    Comment: rarely drinks   Drug use: No   Sexual activity: Yes    Birth control/protection: Surgical  Other Topics Concern   Not on file  Social History Narrative  Not on file   Social Drivers of Health   Financial Resource Strain: Low Risk  (07/27/2023)   Overall Financial Resource Strain (CARDIA)    Difficulty of Paying Living Expenses: Not very hard  Food Insecurity: No Food Insecurity (07/27/2023)   Hunger Vital Sign    Worried About Running Out of Food in the Last Year: Never true    Ran Out of Food in the Last Year: Never true  Transportation Needs: No Transportation Needs (07/27/2023)   PRAPARE - Administrator, Civil Service (Medical): No    Lack of Transportation (Non-Medical): No  Physical Activity: Insufficiently Active (07/27/2023)   Exercise Vital Sign    Days of Exercise per Week: 2 days    Minutes of  Exercise per Session: 30 min  Stress: No Stress Concern Present (07/27/2023)   [employer] of Occupational Health - Occupational Stress Questionnaire    Feeling of Stress : Only a little  Social Connections: Moderately Integrated (07/27/2023)   Social Connection and Isolation Panel    Frequency of Communication with Friends and Family: More than three times a week    Frequency of Social Gatherings with Friends and Family: More than three times a week    Attends Religious Services: 1 to 4 times per year    Active Member of [employer] or Organizations: No    Attends Engineer, structural: Not on file    Marital Status: Married  Catering manager Violence: Not on file      Review of Systems  All other systems reviewed and are negative.      Objective:   Physical Exam Vitals reviewed.  Constitutional:      General: He is not in acute distress.    Appearance: Normal appearance. He is well-developed and normal weight. He is not ill-appearing, toxic-appearing or diaphoretic.  Eyes:     General: No scleral icterus.       Right eye: No discharge.        Left eye: No discharge.     Conjunctiva/sclera: Conjunctivae normal.  Neck:     Thyroid: No thyromegaly.     Vascular: No carotid bruit or JVD.  Cardiovascular:     Rate and Rhythm: Normal rate and regular rhythm.     Pulses: Normal pulses.     Heart sounds: Normal heart sounds. No murmur heard.    No friction rub. No gallop.  Pulmonary:     Effort: Pulmonary effort is normal. No respiratory distress.     Breath sounds: Normal breath sounds. No stridor. No wheezing or rales.  Chest:     Chest wall: No tenderness.  Abdominal:     General: Bowel sounds are normal. There is no distension.     Palpations: Abdomen is soft. There is no mass.     Tenderness: There is no abdominal tenderness. There is no guarding or rebound.  Musculoskeletal:        General: No swelling.     Cervical back: Normal range of motion and neck  supple. No tenderness.     Right lower leg: No edema.     Left lower leg: No edema.  Lymphadenopathy:     Cervical: No cervical adenopathy.  Skin:    Coloration: Skin is not jaundiced.     Findings: No bruising, erythema, lesion or rash.  Neurological:     General: No focal deficit present.     Mental Status: He is alert and oriented to person, place, and  time. Mental status is at baseline.     Cranial Nerves: No cranial nerve deficit.     Sensory: No sensory deficit.     Motor: No weakness or abnormal muscle tone.     Coordination: Coordination normal.     Gait: Gait normal.     Deep Tendon Reflexes: Reflexes are normal and symmetric. Reflexes normal.  Psychiatric:        Mood and Affect: Mood normal.        Behavior: Behavior normal.        Thought Content: Thought content normal.        Judgment: Judgment normal.           Assessment & Plan:  DOE (dyspnea on exertion)  Hypertriglyceridemia - Plan: fenofibrate  160 MG tablet, CBC with Differential/Platelet, Comprehensive metabolic panel with GFR, Lipid panel, CT CARDIAC SCORING (SELF PAY ONLY) Physical exam today is completely normal.  Blood pressure is excellent.  I suspect that this could be related to deconditioning.  However we discussed his options and patient is interested in a coronary artery calcium  score to evaluate for the presence of coronary artery disease/atherosclerosis.  If CT scan is normal and lab work is normal, I believe that we can attribute this to deconditioning.  If the patient develops angina or orthopnea or paroxysmal nocturnal dyspnea, we could also consider an echocardiogram however he has no physical stigmata of congestive heart failure

## 2024-06-10 LAB — CBC WITH DIFFERENTIAL/PLATELET
Absolute Lymphocytes: 1848 {cells}/uL (ref 850–3900)
Absolute Monocytes: 468 {cells}/uL (ref 200–950)
Basophils Absolute: 60 {cells}/uL (ref 0–200)
Basophils Relative: 1 %
Eosinophils Absolute: 102 {cells}/uL (ref 15–500)
Eosinophils Relative: 1.7 %
HCT: 45.6 % (ref 38.5–50.0)
Hemoglobin: 15.2 g/dL (ref 13.2–17.1)
MCH: 32.1 pg (ref 27.0–33.0)
MCHC: 33.3 g/dL (ref 32.0–36.0)
MCV: 96.2 fL (ref 80.0–100.0)
MPV: 9.3 fL (ref 7.5–12.5)
Monocytes Relative: 7.8 %
Neutro Abs: 3522 {cells}/uL (ref 1500–7800)
Neutrophils Relative %: 58.7 %
Platelets: 278 Thousand/uL (ref 140–400)
RBC: 4.74 Million/uL (ref 4.20–5.80)
RDW: 12.5 % (ref 11.0–15.0)
Total Lymphocyte: 30.8 %
WBC: 6 Thousand/uL (ref 3.8–10.8)

## 2024-06-10 LAB — COMPREHENSIVE METABOLIC PANEL WITH GFR
AG Ratio: 2.1 (calc) (ref 1.0–2.5)
ALT: 153 U/L — ABNORMAL HIGH (ref 9–46)
AST: 68 U/L — ABNORMAL HIGH (ref 10–40)
Albumin: 4.6 g/dL (ref 3.6–5.1)
Alkaline phosphatase (APISO): 55 U/L (ref 36–130)
BUN: 17 mg/dL (ref 7–25)
CO2: 24 mmol/L (ref 20–32)
Calcium: 9.9 mg/dL (ref 8.6–10.3)
Chloride: 106 mmol/L (ref 98–110)
Creat: 0.93 mg/dL (ref 0.60–1.29)
Globulin: 2.2 g/dL (ref 1.9–3.7)
Glucose, Bld: 116 mg/dL — ABNORMAL HIGH (ref 65–99)
Potassium: 4.4 mmol/L (ref 3.5–5.3)
Sodium: 141 mmol/L (ref 135–146)
Total Bilirubin: 0.7 mg/dL (ref 0.2–1.2)
Total Protein: 6.8 g/dL (ref 6.1–8.1)
eGFR: 104 mL/min/1.73m2 (ref >=60)

## 2024-06-10 LAB — LIPID PANEL
Cholesterol: 115 mg/dL (ref <200)
HDL: 27 mg/dL — ABNORMAL LOW (ref >=40)
LDL Cholesterol (Calc): 54 mg/dL
Non-HDL Cholesterol (Calc): 88 mg/dL (ref <130)
Total CHOL/HDL Ratio: 4.3 (calc) (ref <5.0)
Triglycerides: 324 mg/dL — ABNORMAL HIGH (ref <150)

## 2024-06-12 ENCOUNTER — Ambulatory Visit: Payer: Self-pay | Admitting: Family Medicine

## 2024-06-13 NOTE — Telephone Encounter (Signed)
 Pt. Responded to result message stating that he has been out of Fenofibrate  for over two weeks. Would you prefer he resume that medication? Also, do you still recommend that a referral for Hepatology is still placed? Please advise.

## 2024-06-14 ENCOUNTER — Other Ambulatory Visit: Payer: Self-pay

## 2024-06-14 DIAGNOSIS — E781 Pure hyperglyceridemia: Secondary | ICD-10-CM

## 2024-06-27 ENCOUNTER — Other Ambulatory Visit (HOSPITAL_BASED_OUTPATIENT_CLINIC_OR_DEPARTMENT_OTHER)

## 2024-07-02 ENCOUNTER — Other Ambulatory Visit: Payer: Self-pay | Admitting: Family Medicine

## 2024-07-03 DIAGNOSIS — G4733 Obstructive sleep apnea (adult) (pediatric): Secondary | ICD-10-CM | POA: Diagnosis not present

## 2024-07-06 ENCOUNTER — Telehealth: Payer: Self-pay | Admitting: Family Medicine

## 2024-07-06 NOTE — Telephone Encounter (Signed)
 Copied from CRM #[id number]. Topic: Referral - Question >> Jul 06, 2024 12:07 PM Winona R wrote: Meagan from [facility] call to [facility] with someone in the referral dept as they received a referral but its titled for a different office and they wanted to be sure it gets to where it needs to be. Call back [phone]

## 2024-07-07 ENCOUNTER — Ambulatory Visit (HOSPITAL_BASED_OUTPATIENT_CLINIC_OR_DEPARTMENT_OTHER)
Admission: RE | Admit: 2024-07-07 | Discharge: 2024-07-07 | Disposition: A | Discharge status: Home / Self Care | Payer: Self-pay | Source: Ambulatory Visit | Attending: Family Medicine | Admitting: Family Medicine

## 2024-07-07 DIAGNOSIS — E781 Pure hyperglyceridemia: Secondary | ICD-10-CM | POA: Insufficient documentation

## 2024-07-09 ENCOUNTER — Encounter: Payer: Self-pay | Admitting: Family Medicine

## 2024-07-10 ENCOUNTER — Telehealth: Payer: Self-pay

## 2024-07-10 ENCOUNTER — Other Ambulatory Visit: Payer: Self-pay

## 2024-07-10 DIAGNOSIS — M1A0790 Idiopathic chronic gout, unspecified ankle and foot, without tophus (tophi): Secondary | ICD-10-CM

## 2024-07-10 MED ORDER — ALLOPURINOL 300 MG PO TABS: 300.0000 mg | ORAL_TABLET | Freq: Every day | ORAL | 0 refills | Status: DC

## 2024-07-10 MED ORDER — ATORVASTATIN CALCIUM 20 MG PO TABS: 20.0000 mg | ORAL_TABLET | Freq: Every day | ORAL | 0 refills | Status: DC

## 2024-07-10 NOTE — Telephone Encounter (Signed)
 Addressed in separate encounter.

## 2024-07-10 NOTE — Telephone Encounter (Signed)
 Prescription Request  07/10/2024  LOV: 06/09/24  What is the name of the medication or equipment? atorvastatin  (LIPITOR) 20 MG tablet [[id number]]   Have you contacted your pharmacy to request a refill? Yes   Which pharmacy would you like this sent to?  [id number] [place], [place] - [address] [place] [address] Phone: [phone] Fax: [phone]    Patient notified that their request is being sent to the clinical staff for review and that they should receive a response within 2 business days.   Please advise at [facility] [phone]  Prescription Request  07/10/2024  LOV: 06/09/24  What is the name of the medication or equipment? allopurinol  (ZYLOPRIM ) 300 MG tablet [[id number]]    Have you contacted your pharmacy to request a refill? Yes   Which pharmacy would you like this sent to?  [id number] [place], [place] - [address]  [place] [address] Phone: [phone] Fax: [phone]    Patient notified that their request is being sent to the clinical staff for review and that they should receive a response within 2 business days.   Please advise at [facility] [phone]

## 2024-07-11 ENCOUNTER — Telehealth: Payer: Self-pay | Admitting: Family Medicine

## 2024-07-11 NOTE — Telephone Encounter (Signed)
 Copied from CRM [id number]. Topic: Referral - Question >> Jul 11, 2024  9:50 AM Delon DASEN wrote: Reason for CRM: Megan with [facility], patient told them that he was told he did not need the hepatology consult at this time, please call [phone]

## 2024-07-14 ENCOUNTER — Telehealth: Payer: Self-pay | Admitting: Family Medicine

## 2024-07-14 NOTE — Telephone Encounter (Unsigned)
 Copied from CRM #[id number]. Topic: Referral - Question >> Jul 14, 2024  8:22 AM Amy B wrote: Reason for CRM: Duwaine with [facility] states they received a call from the patient informing them that Dr. Duanne told him he did not need to see hepatology.  She is calling for clarification and need to know if they need to close the referral.  Please call [phone]

## 2024-08-23 DIAGNOSIS — G4733 Obstructive sleep apnea (adult) (pediatric): Secondary | ICD-10-CM | POA: Diagnosis not present

## 2024-10-06 ENCOUNTER — Other Ambulatory Visit: Payer: Self-pay | Admitting: Family Medicine

## 2024-10-06 DIAGNOSIS — M1A0790 Idiopathic chronic gout, unspecified ankle and foot, without tophus (tophi): Secondary | ICD-10-CM

## 2024-11-24 ENCOUNTER — Encounter: Admitting: Family Medicine
